# Patient Record
Sex: Female | Born: 1975 | Race: White | Hispanic: No | Smoking: Current every day smoker
Health system: Southern US, Community
[De-identification: ages and names within clinical notes are randomized; demographics above are authoritative.]

## PROBLEM LIST (undated history)

## (undated) DIAGNOSIS — K8020 Calculus of gallbladder without cholecystitis without obstruction: Secondary | ICD-10-CM

## (undated) DIAGNOSIS — D219 Benign neoplasm of connective and other soft tissue, unspecified: Secondary | ICD-10-CM

## (undated) HISTORY — PX: BACK SURGERY: SHX140

---

## 2004-04-26 ENCOUNTER — Emergency Department (HOSPITAL_COMMUNITY): Admission: EM | Admit: 2004-04-26 | Discharge: 2004-04-26 | Payer: Self-pay | Admitting: Emergency Medicine

## 2004-05-06 ENCOUNTER — Ambulatory Visit (HOSPITAL_COMMUNITY): Admission: RE | Admit: 2004-05-06 | Discharge: 2004-05-07 | Payer: Self-pay | Admitting: Neurosurgery

## 2004-05-06 ENCOUNTER — Emergency Department (HOSPITAL_COMMUNITY): Admission: EM | Admit: 2004-05-06 | Discharge: 2004-05-06 | Payer: Self-pay | Admitting: Emergency Medicine

## 2004-07-14 ENCOUNTER — Ambulatory Visit (HOSPITAL_COMMUNITY): Admission: RE | Admit: 2004-07-14 | Discharge: 2004-07-14 | Payer: Self-pay | Admitting: Neurosurgery

## 2005-01-03 ENCOUNTER — Emergency Department (HOSPITAL_COMMUNITY): Admission: EM | Admit: 2005-01-03 | Discharge: 2005-01-03 | Payer: Self-pay | Admitting: Emergency Medicine

## 2009-10-17 ENCOUNTER — Emergency Department (HOSPITAL_COMMUNITY): Admission: EM | Admit: 2009-10-17 | Discharge: 2009-10-17 | Payer: Self-pay | Admitting: Emergency Medicine

## 2011-03-09 LAB — POCT I-STAT, CHEM 8
BUN: 10 mg/dL (ref 6–23)
Hemoglobin: 15.6 g/dL — ABNORMAL HIGH (ref 12.0–15.0)
TCO2: 23 mmol/L (ref 0–100)

## 2011-03-09 LAB — URINALYSIS, ROUTINE W REFLEX MICROSCOPIC
Glucose, UA: NEGATIVE mg/dL
Leukocytes, UA: NEGATIVE
Nitrite: NEGATIVE
Protein, ur: NEGATIVE mg/dL
Specific Gravity, Urine: 1.02 (ref 1.005–1.030)
pH: 5.5 (ref 5.0–8.0)

## 2011-03-09 LAB — GC/CHLAMYDIA PROBE AMP, GENITAL: GC Probe Amp, Genital: NEGATIVE

## 2011-03-09 LAB — URINE MICROSCOPIC-ADD ON

## 2011-03-09 LAB — WET PREP, GENITAL
Trich, Wet Prep: NONE SEEN
WBC, Wet Prep HPF POC: NONE SEEN

## 2011-04-22 NOTE — H&P (Signed)
Destiny Mercado, Destiny Mercado                         ACCOUNT NO.:  [id number]   MEDICAL RECORD NO.:  [id number]                   PATIENT TYPE:  OIB   LOCATION:  2899                                 FACILITY:  [facility]   PHYSICIAN:  Payton Doughty, M.D.                   DATE OF BIRTH:  13-Apr-1976   DATE OF ADMISSION:  05/06/2004  DATE OF DISCHARGE:                                HISTORY & PHYSICAL   ADMISSION DIAGNOSIS:  Herniated disk, L4-5, L5-S1, on the left.   Very nice 35 year old white girl who the past couple of weeks had pain in  her leg. Got a lot worse over the past couple days, pain in her back and  down her left leg. Went to [facility] emergency room about 5 this  morning. Ended up with a MRI that shows a herniated disk at 4-5 and 5-1. He  came to my office with difficulty getting her foot flat on the floor and  unable to stand up straight. Bladder has not been affected. Medical history  is completely benign with no operations, using only ibuprofen, and no  allergies.   SOCIAL HISTORY:  She smokes a pack of cigarettes a day, drinks alcohol on  social basis, works as a Production assistant, radio in a [employer]   FAMILY HISTORY:  Mom is 51, dad is 50, both are in good health with no  complications. There is diabetes in the family.   REVIEW OF SYSTEMS:  Remarkable for back pain, leg pain.   PHYSICAL EXAMINATION:  HEENT:  Within normal limits.  NECK:  She has good range of motion of her neck.  CHEST:  Clear.  CARDIAC:  Regular rate and rhythm.  ABDOMEN:  Nontender, no hepatosplenomegaly.  EXTREMITIES:  Without clubbing or cyanosis. Peripheral pulses are good.  GENITOURINARY:  Deferred.  NEUROLOGICAL:  She is awake, alert, and oriented. Cranial nerves are intact.  Motor exam shows 5/5 strength throughout the upper and lower extremities  save for the dorsi flexion of the left foot. Exam is very difficult because  she cannot sit, but she can dorsi and plantar flex her foot, but is  slightly  weaker than the right. She has numbness in L5 and S1 distribution on the  left. Straight leg raising is exquisitely positive.   STUDIES:  MR shows a large disk at 4-5 on the left __________ disk at 5-1 on  the left.   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   PLAN:  Lumbar laminectomy diskectomy at 4-5 and 5-1 on the left side. The  risks and benefits of this approach have been discussed with her, and she  wishes to proceed.  Payton Doughty, M.D.   MWR/MEDQ  D:  05/06/2004  T:  05/06/2004  Job:  [id number]

## 2011-04-22 NOTE — Op Note (Signed)
Destiny Mercado, Destiny Mercado                         ACCOUNT NO.:  [id number]   MEDICAL RECORD NO.:  [id number]                   PATIENT TYPE:  OIB   LOCATION:  2899                                 FACILITY:  [facility]   PHYSICIAN:  Payton Doughty, M.D.                   DATE OF BIRTH:  07-Feb-1976   DATE OF PROCEDURE:  05/06/2004  DATE OF DISCHARGE:                                 OPERATIVE REPORT   PREOPERATIVE DIAGNOSIS:  Herniated disc L4-L5 and L5-S1, left.   POSTOPERATIVE DIAGNOSIS:  Herniated disc L4-L5 and L5-S1, left.   OPERATIVE PROCEDURE:  L4-L5 and L5-S1 laminectomy and discectomy on the left  side.   SURGEON:  Payton Doughty, M.D.   SERVICE:  Neurosurgery   ANESTHESIA:  General endotracheal anesthesia.   PREPARATION:  Betadine and alcohol wipe.   COMPLICATIONS:  None.   BODY OF TEXT:  35 year old girl with left L5 and S1 radiculopathy.  She has  a herniated disc at L4-L5 and L5-S1.  She is taken to the operating room,  smoothly anesthetized and intubated, placed prone on the operating table.  Following shave, prep, and drape in the usual sterile fashion, the skin was  infiltrated with 1% lidocaine with 1:100,000 epinephrine.  The skin was  incised from mid L4 to mid S1 and the lamina of L4 and L5 were exposed on  the left side in a subperiosteal plane.  Interoperative x-ray confirmed  correctness of the  level.  Semi-hemilaminectomy was carried out first at L5  to the top of ligamentum flavum that was removed in a retrograde fashion.  This allowed exposure of the lateral aspect of the S1 nerve root which was  gently dissected free.  Underneath it was found the herniated disc with a  few remaining annular fibers.  These fibers were gently divided and extruded  disc material removed.  The disc space was carefully explored and all  graspable fragments were removed.  The nerve was explored in all quadrants  and found to be free.  Attention was turned to the 4-5 level where a  semi-  hemilaminectomy was carried out to the top of ligamentum flavum and this  level was also removed in a retrograde fashion.  This demonstrated the  lateral aspect of the L5 root which was gently dissected free and a large  herniated disc fragment was located underneath it.  It was grasped and  removed without difficulty.  The disc space here was well covered and the  edges of it were explored and no graspable fragments were found.  The nerve  root was explored in all quadrants and found to be free.  The wound was  irrigated and hemostasis assured.  Depo-Medrol soaked fat was used to cover  the laminectomy defect.  The fascia was reapproximated with 0 Vicryl in an  interrupted fashion, the subcutaneous tissue were reapproximated with 0  Vicryl in an interrupted fashion, the subcuticular tissues were  reapproximated with 3-0 Vicryl in an interrupted fashion, and the skin was  closed with 4-0 Vicryl in a running subcuticular fashion.  Betadine and  Telfa dressing was applied and made occlusive with OpSite.  The patient  returned to the recovery room in good condition.                                              Payton Doughty, M.D.   MWR/MEDQ  D:  05/06/2004  T:  05/06/2004  Job:  [id number]

## 2018-05-16 ENCOUNTER — Telehealth: Payer: Self-pay | Admitting: General Practice

## 2018-05-16 NOTE — Telephone Encounter (Signed)
-----   Message from [name] sent at 05/16/2018  9:01 AM EDT ----- Destiny Mercado this patient needs a breast center appointment. She found a knot in her breast, and does not have a PCP.  Thanks

## 2018-05-16 NOTE — Telephone Encounter (Signed)
Called patient stating I am returning her phone call and asked patient for more information regarding mammogram hx, insurance status and description of the lump she found. Patient states she has never had a mammogram & currently does not have insurance. Patient reports recently palpating a quarter size knot right above her nipple of the right breast. Discussed with patient we will refer her to [facility], which is a program our office partners with. They will see/assess her and ensure she receives proper follow up without incurring a large bill so cost can be covered. Patient verbalized understanding and will await phone call. Email sent to [facility]

## 2018-05-24 ENCOUNTER — Other Ambulatory Visit (HOSPITAL_COMMUNITY): Payer: Self-pay | Admitting: *Deleted

## 2018-05-24 DIAGNOSIS — N6310 Unspecified lump in the right breast, unspecified quadrant: Secondary | ICD-10-CM

## 2018-06-05 ENCOUNTER — Encounter (HOSPITAL_COMMUNITY): Payer: Self-pay

## 2018-06-05 ENCOUNTER — Ambulatory Visit
Admission: RE | Admit: 2018-06-05 | Discharge: 2018-06-05 | Disposition: A | Discharge status: Home / Self Care | Payer: Self-pay | Source: Ambulatory Visit | Attending: Obstetrics and Gynecology | Admitting: Obstetrics and Gynecology

## 2018-06-05 ENCOUNTER — Ambulatory Visit (HOSPITAL_COMMUNITY)
Admission: RE | Admit: 2018-06-05 | Discharge: 2018-06-05 | Disposition: A | Discharge status: Home / Self Care | Payer: Self-pay | Source: Ambulatory Visit | Attending: Obstetrics and Gynecology | Admitting: Obstetrics and Gynecology

## 2018-06-05 VITALS — BP 112/70 | Ht 66.5 in | Wt 158.0 lb

## 2018-06-05 DIAGNOSIS — N6315 Unspecified lump in the right breast, overlapping quadrants: Secondary | ICD-10-CM

## 2018-06-05 DIAGNOSIS — Z01419 Encounter for gynecological examination (general) (routine) without abnormal findings: Secondary | ICD-10-CM

## 2018-06-05 DIAGNOSIS — N6310 Unspecified lump in the right breast, unspecified quadrant: Secondary | ICD-10-CM

## 2018-06-05 NOTE — Progress Notes (Signed)
Complaints of right breast lump x 3 weeks.  Pap Smear: Pap smear completed today. Last Pap smear was 5-6 years ago in [place] and normal per patient. Per patient has a history of an abnormal Pap smear when she was 60 or 42 years old that a colposcopy was completed for follow-up. Per patient all Pap smears have been normal since colposcopy. No Pap smear results are in Epic.  Physical exam: Breasts Breasts symmetrical. No skin abnormalities bilateral breasts. No nipple retraction bilateral breasts. No nipple discharge bilateral breasts. No lymphadenopathy. No lumps palpated left breast. Palpated a lump within the right breast at 12 o'clock above the areola. No complaints of pain or tenderness on exam. Referred patient to the [facility] for a diagnostic mammogram and right breast ultrasound. Appointment scheduled for Tuesday, June 05, 2018 at 1050.        Pelvic/Bimanual   Ext Genitalia No lesions, no swelling and no discharge observed on external genitalia.         Vagina Vagina pink and normal texture. No lesions or discharge observed in vagina.          Cervix Cervix is present. Cervix pink and of normal texture. No discharge observed.     Uterus Uterus is present and palpable. Uterus in normal position, enlarged, and firm. Will refer patient to the [facility] [place] for follow-up.        Adnexae Bilateral ovaries present and palpable. No tenderness on palpation.         Rectovaginal No rectal exam completed today since patient had no rectal complaints. No skin abnormalities observed on exam.    Smoking History: Patient is a current smoker. Discussed smoking cessation with patient. Referred to the [facility] Quitline and gave resources to free smoking cessation classes at [facility].  Patient Navigation: Patient education provided. Access to services provided for patient through [facility] program.   Breast and Cervical Cancer Risk Assessment: Patient has a family  history of her maternal grandmother having breast cancer. Patient has no known genetic mutations or history of radiation treatment to the chest before age 57. Per patient has no history of cervical dysplasia. Patient has no history of being immunocompromised or DES exposure in-utero.  Risk Assessment    Risk Scores      06/05/2018   Last edited by: Armond Hang, LPN   5-year risk: 0.7 %   Lifetime risk: 12.1 %

## 2018-06-05 NOTE — Patient Instructions (Signed)
Explained breast self awareness with Destiny Mercado. Let patient know [facility] will cover Pap smears and HPV typing every 5 years unless has a history of abnormal Pap smears. Referred patient to the [facility] for a diagnostic mammogram and right breast ultrasound. Appointment scheduled for Tuesday, June 05, 2018 at 1050. Let patient know will follow up with her within the next couple weeks with results of Pap smear by letter or phone. Told patient that I will refer her to the [facility] [place] for her enlarged uterus. Let patient know that someone from their office should call her with an appointment. Explained to her that the visit will not be covered by [facility] but can complete financial assistance paperwork. Discussed smoking cessation with patient. Referred to the [facility] Quitline and gave resources to free smoking cessation classes at [facility]. Destiny Mercado verbalized understanding.  Destiny Mercado, [name], RN 9:22 AM

## 2018-06-06 ENCOUNTER — Encounter (HOSPITAL_COMMUNITY): Payer: Self-pay | Admitting: *Deleted

## 2018-06-07 LAB — CYTOLOGY - PAP
ADEQUACY: ABSENT
Diagnosis: NEGATIVE
HPV: NOT DETECTED

## 2018-06-12 ENCOUNTER — Encounter: Payer: Self-pay | Admitting: Obstetrics and Gynecology

## 2018-06-15 ENCOUNTER — Encounter (HOSPITAL_COMMUNITY): Payer: Self-pay | Admitting: *Deleted

## 2018-06-15 NOTE — Progress Notes (Signed)
Letter mailed to patient with negative pap smear results. HPV was negative. Next pap smear due in five years. 

## 2018-07-24 ENCOUNTER — Encounter: Payer: Self-pay | Admitting: Obstetrics and Gynecology

## 2020-04-24 ENCOUNTER — Emergency Department (HOSPITAL_BASED_OUTPATIENT_CLINIC_OR_DEPARTMENT_OTHER): Payer: Self-pay

## 2020-04-24 ENCOUNTER — Encounter (HOSPITAL_BASED_OUTPATIENT_CLINIC_OR_DEPARTMENT_OTHER): Payer: Self-pay | Admitting: Emergency Medicine

## 2020-04-24 ENCOUNTER — Emergency Department (HOSPITAL_BASED_OUTPATIENT_CLINIC_OR_DEPARTMENT_OTHER)
Admission: EM | Admit: 2020-04-24 | Discharge: 2020-04-24 | Disposition: A | Discharge status: Home / Self Care | Payer: Self-pay | Attending: Emergency Medicine | Admitting: Emergency Medicine

## 2020-04-24 ENCOUNTER — Other Ambulatory Visit: Payer: Self-pay

## 2020-04-24 DIAGNOSIS — F17210 Nicotine dependence, cigarettes, uncomplicated: Secondary | ICD-10-CM | POA: Insufficient documentation

## 2020-04-24 DIAGNOSIS — D259 Leiomyoma of uterus, unspecified: Secondary | ICD-10-CM | POA: Insufficient documentation

## 2020-04-24 DIAGNOSIS — K8020 Calculus of gallbladder without cholecystitis without obstruction: Secondary | ICD-10-CM | POA: Insufficient documentation

## 2020-04-24 DIAGNOSIS — K8050 Calculus of bile duct without cholangitis or cholecystitis without obstruction: Secondary | ICD-10-CM

## 2020-04-24 DIAGNOSIS — R1900 Intra-abdominal and pelvic swelling, mass and lump, unspecified site: Secondary | ICD-10-CM | POA: Insufficient documentation

## 2020-04-24 LAB — CBC WITH DIFFERENTIAL/PLATELET
Abs Immature Granulocytes: 0.02 10*3/uL (ref 0.00–0.07)
Basophils Absolute: 0.1 10*3/uL (ref 0.0–0.1)
Basophils Relative: 1 %
Eosinophils Absolute: 0.2 10*3/uL (ref 0.0–0.5)
Eosinophils Relative: 3 %
HCT: 43 % (ref 36.0–46.0)
Hemoglobin: 14.6 g/dL (ref 12.0–15.0)
Immature Granulocytes: 0 %
Lymphocytes Relative: 23 %
Lymphs Abs: 1.9 10*3/uL (ref 0.7–4.0)
MCH: 32.7 pg (ref 26.0–34.0)
MCHC: 34 g/dL (ref 30.0–36.0)
MCV: 96.4 fL (ref 80.0–100.0)
Monocytes Absolute: 0.5 10*3/uL (ref 0.1–1.0)
Monocytes Relative: 7 %
Neutro Abs: 5.3 10*3/uL (ref 1.7–7.7)
Neutrophils Relative %: 66 %
Platelets: 226 10*3/uL (ref 150–400)
RBC: 4.46 MIL/uL (ref 3.87–5.11)
RDW: 13.4 % (ref 11.5–15.5)
WBC: 7.9 10*3/uL (ref 4.0–10.5)
nRBC: 0 % (ref 0.0–0.2)

## 2020-04-24 LAB — BASIC METABOLIC PANEL
Anion gap: 8 (ref 5–15)
BUN: 12 mg/dL (ref 6–20)
CO2: 24 mmol/L (ref 22–32)
Calcium: 9.1 mg/dL (ref 8.9–10.3)
Chloride: 103 mmol/L (ref 98–111)
Creatinine, Ser: 0.98 mg/dL (ref 0.44–1.00)
GFR calc Af Amer: >60 mL/min (ref >60)
GFR calc non Af Amer: >60 mL/min (ref >60)
Glucose, Bld: 95 mg/dL (ref 70–99)
Potassium: 4.2 mmol/L (ref 3.5–5.1)
Sodium: 135 mmol/L (ref 135–145)

## 2020-04-24 LAB — URINALYSIS, ROUTINE W REFLEX MICROSCOPIC
Bilirubin Urine: NEGATIVE
Glucose, UA: NEGATIVE mg/dL
Ketones, ur: NEGATIVE mg/dL
Leukocytes,Ua: NEGATIVE
Nitrite: NEGATIVE
Protein, ur: NEGATIVE mg/dL
Specific Gravity, Urine: 1.025 (ref 1.005–1.030)
pH: 5.5 (ref 5.0–8.0)

## 2020-04-24 LAB — LIPASE, BLOOD: Lipase: 40 U/L (ref 11–51)

## 2020-04-24 LAB — URINALYSIS, MICROSCOPIC (REFLEX)

## 2020-04-24 LAB — HEPATIC FUNCTION PANEL
ALT: 18 U/L (ref 0–44)
AST: 17 U/L (ref 15–41)
Albumin: 4.3 g/dL (ref 3.5–5.0)
Alkaline Phosphatase: 57 U/L (ref 38–126)
Bilirubin, Direct: <0.1 mg/dL (ref 0.0–0.2)
Total Bilirubin: 0.4 mg/dL (ref 0.3–1.2)
Total Protein: 7.5 g/dL (ref 6.5–8.1)

## 2020-04-24 LAB — PREGNANCY, URINE: Preg Test, Ur: NEGATIVE

## 2020-04-24 MED ORDER — IOHEXOL 300 MG/ML  SOLN
100.0000 mL | Freq: Once | INTRAMUSCULAR | Status: AC | PRN
  Administered 2020-04-24: 100 mL via INTRAVENOUS

## 2020-04-24 NOTE — ED Provider Notes (Signed)
[facility] EMERGENCY DEPARTMENT Provider Note   CSN: [id number] Arrival date & time: 04/24/20  1515     History Chief Complaint  Patient presents with  . Abdominal Pain    Destiny Mercado is a 44 y.o. female.  HPI Patient presents with abdominal pain and mass.  Began around an hour and half ago.  States she has had her abdomen swell up.  Last menses was a month ago.  Denies pregnancy.  States she felt a small bump in her abdomen around a month ago but that resolved.  No fevers.  No previous abdominal surgeries.  No nausea or vomiting.  No diarrhea or constipation.    History reviewed. No pertinent past medical history.  There are no problems to display for this patient.   Past Surgical History:  Procedure Laterality Date  . BACK SURGERY       OB History    Gravida  0   Para  0   Term  0   Preterm  0   AB  0   Living  0     SAB  0   TAB  0   Ectopic  0   Multiple  0   Live Births  0           Family History  Problem Relation Age of Onset  . Diabetes Brother   . Breast cancer Maternal Grandmother     Social History   Tobacco Use  . Smoking status: Current Every Day Smoker    Packs/day: 0.25    Years: 20.00    Pack years: 5.00    Types: Cigarettes  . Smokeless tobacco: Never Used  Substance Use Topics  . Alcohol use: Yes    Comment: occassionally  . Drug use: Never    Home Medications Prior to Admission medications   Not on File    Allergies    Patient has no known allergies.  Review of Systems   Review of Systems  Constitutional: Negative for appetite change.  HENT: Negative for congestion.   Respiratory: Negative for shortness of breath.   Gastrointestinal: Positive for abdominal pain. Negative for vomiting.  Genitourinary: Negative for flank pain.  Musculoskeletal: Negative for back pain.  Neurological: Negative for weakness.  Psychiatric/Behavioral: Negative for confusion.    Physical Exam Updated Vital  Signs BP 140/87 (BP Location: Right Arm)   Pulse 88   Temp 98.5 F (36.9 C) (Oral)   Resp 20   Ht 5\' 6"  (1.676 m)   Wt 72 kg   LMP 03/30/2020 (Approximate)   SpO2 99%   BMI 25.62 kg/m   Physical Exam Vitals and nursing note reviewed.  Cardiovascular:     Rate and Rhythm: Normal rate.  Pulmonary:     Breath sounds: Normal breath sounds.  Abdominal:     General: There is no abdominal bruit.     Palpations: There is mass.     Hernia: No hernia is present.     Comments: Suprapubic/lower abdominal mass.  Goes to above umbilicus.  Nonpulsatile.  Skin:    General: Skin is warm.     Capillary Refill: Capillary refill takes less than 2 seconds.  Neurological:     Mental Status: She is alert.     ED Results / Procedures / Treatments   Labs (all labs ordered are listed, but only abnormal results are displayed) Labs Reviewed  URINALYSIS, ROUTINE W REFLEX MICROSCOPIC - Abnormal; Notable for the following components:  Result Value   Hgb urine dipstick SMALL (*)    All other components within normal limits  URINALYSIS, MICROSCOPIC (REFLEX) - Abnormal; Notable for the following components:   Bacteria, UA RARE (*)    All other components within normal limits  PREGNANCY, URINE  BASIC METABOLIC PANEL  CBC WITH DIFFERENTIAL/PLATELET  HEPATIC FUNCTION PANEL  LIPASE, BLOOD    EKG None  Radiology CT ABDOMEN PELVIS W CONTRAST  Result Date: 04/24/2020 CLINICAL DATA:  Periumbilical abdominal pain. EXAM: CT ABDOMEN AND PELVIS WITH CONTRAST TECHNIQUE: Multidetector CT imaging of the abdomen and pelvis was performed using the standard protocol following bolus administration of intravenous contrast. CONTRAST:  15mL OMNIPAQUE IOHEXOL 300 MG/ML  SOLN COMPARISON:  None. FINDINGS: Lower chest: No acute abnormality. Hepatobiliary: No focal liver abnormality is seen. 1.8 cm and 0.9 cm coarse gallstones are seen within the lumen of an otherwise normal-appearing gallbladder. There is no  evidence of gallbladder wall thickening or biliary dilatation. Pancreas: Unremarkable. No pancreatic ductal dilatation or surrounding inflammatory changes. Spleen: Normal in size without focal abnormality. Adrenals/Urinary Tract: Adrenal glands are unremarkable. Kidneys are normal, without renal calculi, focal lesion, or hydronephrosis. Bladder is unremarkable. Stomach/Bowel: Stomach is within normal limits. Appendix appears normal. No evidence of bowel wall thickening, distention, or inflammatory changes. Vascular/Lymphatic: No significant vascular findings are present. No enlarged abdominal or pelvic lymph nodes. Reproductive: Numerous heterogeneous low-attenuation masses of various sizes are seen throughout an enlarged, lobulated uterus. A 2.0 cm cyst is seen along the posterior aspect of the right adnexa. Other: No abdominal wall hernia or abnormality. No abdominopelvic ascites. Musculoskeletal: Moderate severity degenerative changes seen at the levels of L4-L5 and L5-S1. IMPRESSION: 1. Cholelithiasis. 2. Enlarged, fibroid uterus. 3. Small right adnexal cyst, likely ovarian in origin. Electronically Signed   By: Virgina Norfolk M.D.   On: 04/24/2020 17:30   DG Abd 2 Views  Result Date: 04/24/2020 CLINICAL DATA:  Sudden onset of mid abdominal pain. Abdominal mass on ultrasound. EXAM: ABDOMEN - 2 VIEW COMPARISON:  None. FINDINGS: No free intra-abdominal air. No bowel dilatation to suggest obstruction. Moderate stool in the proximal colon, small volume of stool distally. Few adjacent rounded calcifications in the right upper quadrant measuring up to 17 mm, suspicious for gallstones but nonspecific. There is soft tissue fullness in the central abdomen on the supine view. Calcifications in the pelvis are typical of phleboliths. Lung bases are clear. No osseous abnormalities. IMPRESSION: 1. Soft tissue fullness in the central abdomen on the supine view, may represent reported abdominal mass, nonspecific in  radiographic appearance. Recommend further evaluation with CT. 2. Probable gallstones. Electronically Signed   By: Keith Rake M.D.   On: 04/24/2020 16:30    Procedures Procedures (including critical care time)  Medications Ordered in ED Medications  iohexol (OMNIPAQUE) 300 MG/ML solution 100 mL (100 mLs Intravenous Contrast Given 04/24/20 1644)    ED Course  I have reviewed the triage vital signs and the nursing notes.  Pertinent labs & imaging results that were available during my care of the patient were reviewed by me and considered in my medical decision making (see chart for details).    MDM Rules/Calculators/A&P                      Patient with abdominal pain.  Has been periumbilical and initially thought that it could be related to uterine fibroids.  Had unknown mass in abdomen but found to have large fibroids.  Not pregnant.  However patient states she later developed right upper quadrant pain.  Gallstones seen on CT scan without signs of cholecystitis.  Patient feels somewhat better.  LFTs normal.  I think patient stable for discharge home.  I think more likely biliary colic as opposed to a cholecystitis.  Follow-up with general surgery and GYN. Final Clinical Impression(s) / ED Diagnoses Final diagnoses:  Abdominal mass  Biliary colic  Uterine leiomyoma, unspecified location    Rx / DC Orders ED Discharge Orders    None       Davonna Belling, MD 04/24/20 [phone]

## 2020-04-24 NOTE — ED Triage Notes (Signed)
Sudden onset of mid abd pain 1.5 hours pta. Denies n/v/d/

## 2020-04-24 NOTE — Discharge Instructions (Signed)
Follow-up with OB/GYN for the fibroids and the surgeon for the biliary colic.  If the pain in the upper abdomen continues or worsens you may need to be seen sooner.

## 2021-05-02 ENCOUNTER — Other Ambulatory Visit: Payer: Self-pay

## 2021-05-02 ENCOUNTER — Emergency Department (HOSPITAL_BASED_OUTPATIENT_CLINIC_OR_DEPARTMENT_OTHER): Payer: Self-pay

## 2021-05-02 ENCOUNTER — Encounter (HOSPITAL_BASED_OUTPATIENT_CLINIC_OR_DEPARTMENT_OTHER): Payer: Self-pay | Admitting: *Deleted

## 2021-05-02 ENCOUNTER — Emergency Department (HOSPITAL_BASED_OUTPATIENT_CLINIC_OR_DEPARTMENT_OTHER)
Admission: EM | Admit: 2021-05-02 | Discharge: 2021-05-02 | Disposition: A | Discharge status: Home / Self Care | Payer: Self-pay | Attending: Emergency Medicine | Admitting: Emergency Medicine

## 2021-05-02 DIAGNOSIS — R1010 Upper abdominal pain, unspecified: Secondary | ICD-10-CM | POA: Insufficient documentation

## 2021-05-02 DIAGNOSIS — D219 Benign neoplasm of connective and other soft tissue, unspecified: Secondary | ICD-10-CM | POA: Insufficient documentation

## 2021-05-02 DIAGNOSIS — R1900 Intra-abdominal and pelvic swelling, mass and lump, unspecified site: Secondary | ICD-10-CM | POA: Insufficient documentation

## 2021-05-02 HISTORY — DX: Benign neoplasm of connective and other soft tissue, unspecified: D21.9

## 2021-05-02 HISTORY — DX: Calculus of gallbladder without cholecystitis without obstruction: K80.20

## 2021-05-02 LAB — CBC WITH DIFFERENTIAL/PLATELET
Abs Immature Granulocytes: 0.02 10*3/uL (ref 0.00–0.07)
Basophils Absolute: 0.1 10*3/uL (ref 0.0–0.1)
Basophils Relative: 1 %
Eosinophils Absolute: 0.1 10*3/uL (ref 0.0–0.5)
Eosinophils Relative: 1 %
HCT: 48 % — ABNORMAL HIGH (ref 36.0–46.0)
Hemoglobin: 17 g/dL — ABNORMAL HIGH (ref 12.0–15.0)
Immature Granulocytes: 0 %
Lymphocytes Relative: 16 %
Lymphs Abs: 1.7 10*3/uL (ref 0.7–4.0)
MCH: 34.4 pg — ABNORMAL HIGH (ref 26.0–34.0)
MCHC: 35.4 g/dL (ref 30.0–36.0)
MCV: 97.2 fL (ref 80.0–100.0)
Monocytes Absolute: 0.9 10*3/uL (ref 0.1–1.0)
Monocytes Relative: 9 %
Neutro Abs: 7.4 10*3/uL (ref 1.7–7.7)
Neutrophils Relative %: 73 %
Platelets: 277 10*3/uL (ref 150–400)
RBC: 4.94 MIL/uL (ref 3.87–5.11)
RDW: 13.4 % (ref 11.5–15.5)
WBC: 10.2 10*3/uL (ref 4.0–10.5)
nRBC: 0 % (ref 0.0–0.2)

## 2021-05-02 LAB — URINALYSIS, ROUTINE W REFLEX MICROSCOPIC
Glucose, UA: NEGATIVE mg/dL
Ketones, ur: >80 mg/dL — AB
Leukocytes,Ua: NEGATIVE
Nitrite: NEGATIVE
Protein, ur: 30 mg/dL — AB
Specific Gravity, Urine: 1.015 (ref 1.005–1.030)
pH: 8 (ref 5.0–8.0)

## 2021-05-02 LAB — COMPREHENSIVE METABOLIC PANEL
ALT: 14 U/L (ref 0–44)
AST: 17 U/L (ref 15–41)
Albumin: 4.6 g/dL (ref 3.5–5.0)
Alkaline Phosphatase: 53 U/L (ref 38–126)
Anion gap: 15 (ref 5–15)
BUN: 11 mg/dL (ref 6–20)
CO2: 24 mmol/L (ref 22–32)
Calcium: 9.3 mg/dL (ref 8.9–10.3)
Chloride: 98 mmol/L (ref 98–111)
Creatinine, Ser: 0.97 mg/dL (ref 0.44–1.00)
GFR, Estimated: >60 mL/min (ref >60)
Glucose, Bld: 103 mg/dL — ABNORMAL HIGH (ref 70–99)
Potassium: 3.3 mmol/L — ABNORMAL LOW (ref 3.5–5.1)
Sodium: 137 mmol/L (ref 135–145)
Total Bilirubin: 0.7 mg/dL (ref 0.3–1.2)
Total Protein: 8 g/dL (ref 6.5–8.1)

## 2021-05-02 LAB — URINALYSIS, MICROSCOPIC (REFLEX): Squamous Epithelial / HPF: >50 (ref 0–5)

## 2021-05-02 LAB — LACTIC ACID, PLASMA: Lactic Acid, Venous: 1.1 mmol/L (ref 0.5–1.9)

## 2021-05-02 LAB — LIPASE, BLOOD: Lipase: 24 U/L (ref 11–51)

## 2021-05-02 LAB — PREGNANCY, URINE: Preg Test, Ur: NEGATIVE

## 2021-05-02 MED ORDER — LACTATED RINGERS IV BOLUS
1000.0000 mL | Freq: Once | INTRAVENOUS | Status: AC
  Administered 2021-05-02: 1000 mL via INTRAVENOUS

## 2021-05-02 MED ORDER — PANTOPRAZOLE SODIUM 40 MG IV SOLR
40.0000 mg | Freq: Once | INTRAVENOUS | Status: AC
  Administered 2021-05-02: 40 mg via INTRAVENOUS
  Filled 2021-05-02: qty 40

## 2021-05-02 MED ORDER — HYDROMORPHONE HCL 1 MG/ML IJ SOLN
1.0000 mg | Freq: Once | INTRAMUSCULAR | Status: AC
Start: 2021-05-02 — End: 2021-05-02
  Administered 2021-05-02: 1 mg via INTRAVENOUS
  Filled 2021-05-02: qty 1

## 2021-05-02 MED ORDER — PANTOPRAZOLE SODIUM 40 MG PO TBEC
40.0000 mg | DELAYED_RELEASE_TABLET | Freq: Every day | ORAL | 0 refills | Status: DC
Start: 2021-05-02 — End: 2021-08-04

## 2021-05-02 MED ORDER — ONDANSETRON HCL 4 MG/2ML IJ SOLN
4.0000 mg | Freq: Once | INTRAMUSCULAR | Status: AC
  Administered 2021-05-02: 4 mg via INTRAVENOUS
  Filled 2021-05-02: qty 2

## 2021-05-02 MED ORDER — ONDANSETRON 8 MG PO TBDP: 8.0000 mg | ORAL_TABLET | Freq: Three times a day (TID) | ORAL | 0 refills | Status: DC | PRN

## 2021-05-02 MED ORDER — IOHEXOL 300 MG/ML  SOLN
100.0000 mL | Freq: Once | INTRAMUSCULAR | Status: AC | PRN
  Administered 2021-05-02: 100 mL via INTRAVENOUS

## 2021-05-02 NOTE — ED Notes (Signed)
PO challenge given; primary RN aware

## 2021-05-02 NOTE — ED Provider Notes (Signed)
[facility] DEPT [facility] Provider Note: Destiny Spurling, MD, FACEP  CSN: [id number] MRN: [id number] ARRIVAL: 05/02/21 at [place]: [place]  Abdominal Pain   HISTORY OF PRESENT ILLNESS  05/02/21 2:02 AM Destiny Mercado is a 45 y.o. female with a history of gallstones and massive uterine fibroids.  She is here with 3 weeks of difficulty eating.  Specifically she has difficulty eating or drinking and immediately feels like vomiting.  She has lost over 20 pounds in the past 3 weeks due to this.  She has not had diarrhea or constipation with this.  Yesterday the vomiting became persistent and has been associated with pain.  The pain was initially in the right upper quadrant and has subsequently moved leftward to the epigastrium and the left upper quadrant.  It is not significantly worse with movement or palpation.  She describes her vomiting as bilious.  She rates her pain as a 6 out of 10.   Past Medical History:  Diagnosis Date  . Fibroid   . Gallstones     Past Surgical History:  Procedure Laterality Date  . BACK SURGERY      Family History  Problem Relation Age of Onset  . Diabetes Brother   . Breast cancer Maternal Grandmother     Social History   Tobacco Use  . Smoking status: Current Every Day Smoker    Packs/day: 0.25    Years: 20.00    Pack years: 5.00    Types: Cigarettes  . Smokeless tobacco: Never Used  Vaping Use  . Vaping Use: Never used  Substance Use Topics  . Alcohol use: Yes    Comment: occassionally  . Drug use: Never    Prior to Admission medications   Medication Sig Start Date End Date Taking? Authorizing Provider  ondansetron (ZOFRAN ODT) 8 MG disintegrating tablet Take 1 tablet (8 mg total) by mouth every 8 (eight) hours as needed. 05/02/21  Yes Payten Beaumier, MD  pantoprazole (PROTONIX) 40 MG tablet Take 1 tablet (40 mg total) by mouth daily. 05/02/21  Yes Roxene Alviar, MD    Allergies Patient has no known  allergies.   REVIEW OF SYSTEMS  Negative except as noted here or in the History of Present Illness.   PHYSICAL EXAMINATION  Initial Vital Signs Blood pressure (!) 127/92, pulse (!) 128, temperature 98.4 F (36.9 C), temperature source Oral, resp. rate (!) 22, height 5\' 6"  (1.676 m), weight 62.6 kg, last menstrual period 04/11/2021, SpO2 98 %.  Examination General: Well-developed, well-nourished female in no acute distress; appearance consistent with age of record HENT: normocephalic; atraumatic Eyes: pupils equal, round and reactive to light; extraocular muscles intact Neck: supple Heart: regular rate and rhythm Lungs: clear to auscultation bilaterally Abdomen: soft; nondistended; minimal upper abdominal tenderness; large suprapubic mass; bowel sounds present Extremities: No deformity; full range of motion; pulses normal Neurologic: Awake, alert and oriented; motor function intact in all extremities and symmetric; no facial droop Skin: Warm and dry Psychiatric: Tearful   RESULTS  Summary of this visit's results, reviewed and interpreted by myself:   EKG Interpretation  Date/Time:  Sunday May 02 2021 01:17:33 EDT Ventricular Rate:  99 PR Interval:  125 QRS Duration: 88 QT Interval:  345 QTC Calculation: 443 R Axis:   55 Text Interpretation: Sinus rhythm Normal ECG No previous ECGs available Confirmed by Shanon Rosser [phone]) on 05/02/2021 1:22:33 AM      Laboratory Studies: Results for orders placed or performed during  the hospital encounter of 05/02/21 (from the past 24 hour(s))  Lactic acid, plasma     Status: None   Collection Time: 05/02/21  1:15 AM  Result Value Ref Range   Lactic Acid, Venous 1.1 0.5 - 1.9 mmol/L  Comprehensive metabolic panel     Status: Abnormal   Collection Time: 05/02/21  1:15 AM  Result Value Ref Range   Sodium 137 135 - 145 mmol/L   Potassium 3.3 (L) 3.5 - 5.1 mmol/L   Chloride 98 98 - 111 mmol/L   CO2 24 22 - 32 mmol/L   Glucose, Bld  103 (H) 70 - 99 mg/dL   BUN 11 6 - 20 mg/dL   Creatinine, Ser 0.97 0.44 - 1.00 mg/dL   Calcium 9.3 8.9 - 10.3 mg/dL   Total Protein 8.0 6.5 - 8.1 g/dL   Albumin 4.6 3.5 - 5.0 g/dL   AST 17 15 - 41 U/L   ALT 14 0 - 44 U/L   Alkaline Phosphatase 53 38 - 126 U/L   Total Bilirubin 0.7 0.3 - 1.2 mg/dL   GFR, Estimated >60 >60 mL/min   Anion gap 15 5 - 15  CBC with Differential     Status: Abnormal   Collection Time: 05/02/21  1:15 AM  Result Value Ref Range   WBC 10.2 4.0 - 10.5 K/uL   RBC 4.94 3.87 - 5.11 MIL/uL   Hemoglobin 17.0 (H) 12.0 - 15.0 g/dL   HCT 48.0 (H) 36.0 - 46.0 %   MCV 97.2 80.0 - 100.0 fL   MCH 34.4 (H) 26.0 - 34.0 pg   MCHC 35.4 30.0 - 36.0 g/dL   RDW 13.4 11.5 - 15.5 %   Platelets 277 150 - 400 K/uL   nRBC 0.0 0.0 - 0.2 %   Neutrophils Relative % 73 %   Neutro Abs 7.4 1.7 - 7.7 K/uL   Lymphocytes Relative 16 %   Lymphs Abs 1.7 0.7 - 4.0 K/uL   Monocytes Relative 9 %   Monocytes Absolute 0.9 0.1 - 1.0 K/uL   Eosinophils Relative 1 %   Eosinophils Absolute 0.1 0.0 - 0.5 K/uL   Basophils Relative 1 %   Basophils Absolute 0.1 0.0 - 0.1 K/uL   Immature Granulocytes 0 %   Abs Immature Granulocytes 0.02 0.00 - 0.07 K/uL  Lipase, blood     Status: None   Collection Time: 05/02/21  1:15 AM  Result Value Ref Range   Lipase 24 11 - 51 U/L  Urinalysis, Routine w reflex microscopic Urine, Clean Catch     Status: Abnormal   Collection Time: 05/02/21  2:26 AM  Result Value Ref Range   Color, Urine YELLOW YELLOW   APPearance HAZY (A) CLEAR   Specific Gravity, Urine 1.015 1.005 - 1.030   pH 8.0 5.0 - 8.0   Glucose, UA NEGATIVE NEGATIVE mg/dL   Hgb urine dipstick TRACE (A) NEGATIVE   Bilirubin Urine SMALL (A) NEGATIVE   Ketones, ur >80 (A) NEGATIVE mg/dL   Protein, ur 30 (A) NEGATIVE mg/dL   Nitrite NEGATIVE NEGATIVE   Leukocytes,Ua NEGATIVE NEGATIVE  Pregnancy, urine     Status: None   Collection Time: 05/02/21  2:26 AM  Result Value Ref Range   Preg Test, Ur  NEGATIVE NEGATIVE  Urinalysis, Microscopic (reflex)     Status: Abnormal   Collection Time: 05/02/21  2:26 AM  Result Value Ref Range   RBC / HPF 6-10 0 - 5 RBC/hpf   WBC, UA  0-5 0 - 5 WBC/hpf   Bacteria, UA MANY (A) NONE SEEN   Squamous Epithelial / LPF >50 0 - 5   Mucus PRESENT    Imaging Studies: CT ABDOMEN PELVIS W CONTRAST  Result Date: 05/02/2021 CLINICAL DATA:  Vomiting for 3 weeks, right upper quadrant pain, history of cholelithiasis, history of uterine fibroids EXAM: CT ABDOMEN AND PELVIS WITH CONTRAST TECHNIQUE: Multidetector CT imaging of the abdomen and pelvis was performed using the standard protocol following bolus administration of intravenous contrast. CONTRAST:  186mL OMNIPAQUE IOHEXOL 300 MG/ML  SOLN COMPARISON:  04/24/2020 FINDINGS: Lower chest: No acute pleural or parenchymal lung disease. Hepatobiliary: Gallbladder is decompressed, with multiple calcified gallstones identified within the gallbladder neck. No gallbladder wall thickening to suggest cholecystitis. The liver is unremarkable. No biliary dilation. Pancreas: Unremarkable. No pancreatic ductal dilatation or surrounding inflammatory changes. Spleen: Normal in size without focal abnormality. Adrenals/Urinary Tract: The kidneys enhance normally and symmetrically. No urinary tract calculi or obstruction. The adrenals are unremarkable. Bladder is decompressed, limiting its evaluation. Stomach/Bowel: No bowel obstruction or ileus. Normal appendix right lower quadrant. No bowel wall thickening or inflammatory change. Vascular/Lymphatic: No significant vascular findings are present. No enlarged abdominal or pelvic lymph nodes. Reproductive: There is massive heterogeneous enlargement of the uterus consistent with multiple fibroids. Liver measures approximately 21 x 14 x 16 cm, with numerous intramural and subserosal fibroids. Age-appropriate appearance of the ovaries. Other: There is trace free fluid within the cul-de-sac, likely  physiologic. No free intraperitoneal gas. There is a small fat containing umbilical hernia. No bowel herniation. Musculoskeletal: No acute or destructive bony lesions. Reconstructed images demonstrate no additional findings. IMPRESSION: 1. Cholelithiasis without evidence of cholecystitis. 2. Large fibroid uterus, increased in size since prior study. 3. Trace pelvic free fluid, likely physiologic. 4. Small fat containing umbilical hernia. Electronically Signed   By: Randa Ngo M.D.   On: 05/02/2021 03:44    ED COURSE and MDM  Nursing notes, initial and subsequent vitals signs, including pulse oximetry, reviewed and interpreted by myself.  Vitals:   05/02/21 0221 05/02/21 0230 05/02/21 0400 05/02/21 0500  BP: 125/87 116/80 99/79 99/79   Pulse: 95 (!) 113 97 97  Resp: 20 14 13 20   Temp:      TempSrc:      SpO2: 98% 96% 97% 100%  Weight:      Height:       Medications  ondansetron (ZOFRAN) injection 4 mg (4 mg Intravenous Given 05/02/21 0158)  HYDROmorphone (DILAUDID) injection 1 mg (1 mg Intravenous Given 05/02/21 0212)  lactated ringers bolus 1,000 mL (0 mLs Intravenous Stopped 05/02/21 0417)  iohexol (OMNIPAQUE) 300 MG/ML solution 100 mL (100 mLs Intravenous Contrast Given 05/02/21 0304)  pantoprazole (PROTONIX) injection 40 mg (40 mg Intravenous Given 05/02/21 0411)  lactated ringers bolus 1,000 mL (1,000 mLs Intravenous New Bag/Given 05/02/21 0412)   5:31 AM Patient is nausea controlled with Zofran and she has been able to drink fluids without vomiting.  Her CT does not show any obstruction or other acute cause for her pain but I suspect her pain is likely functional due to mass-effect of her massive fibrotic uterus.  She states she would like to be discharged home at this time.  We will treat her with an antiemetic and PPI and refer to her OB/GYN as she will almost certainly require a hysterectomy.   PROCEDURES  Procedures   ED DIAGNOSES     ICD-10-CM   1. Upper abdominal pain   R10.10  2. Fibroids  D21.9   3. Intraabdominal mass  R19.00        Morine Kohlman, Jenny Reichmann, MD 05/02/21 [phone]

## 2021-05-02 NOTE — ED Triage Notes (Addendum)
States she has not been "able to keep food down" x 3 weeks. She has been told she has gallstones. She reports RUQ pain tonight and "vomiting bile".  Reports > 20lb weight loss in the last 3 weeks

## 2021-05-02 NOTE — ED Notes (Signed)
O2 sat noted to drop after giving dilaudid to 88%.  Placed on 2L.  Rebounded quickly.

## 2021-05-02 NOTE — ED Notes (Signed)
O2 sat remains at 99% on room air after removing oxygen.

## 2021-05-09 IMAGING — CT CT ABD-PELV W/ CM
2 of 5 series · 16 of 46 positions shown, 18 images · IV contrast (Omnipaque)
Comparison: None.

CLINICAL DATA: Periumbilical abdominal pain.

EXAM:
CT ABDOMEN AND PELVIS WITH CONTRAST
TECHNIQUE: Multidetector CT imaging of the abdomen and pelvis was performed
using the standard protocol following bolus administration of
intravenous contrast.
CONTRAST:  100mL OMNIPAQUE IOHEXOL 300 MG/ML  SOLN

[Series 2: axial st · axial · 0.92mm/px · z∈[-434,-4]mm · 13 of 100 slices shown, 15 images]
[im 7/100  soft-tissue]
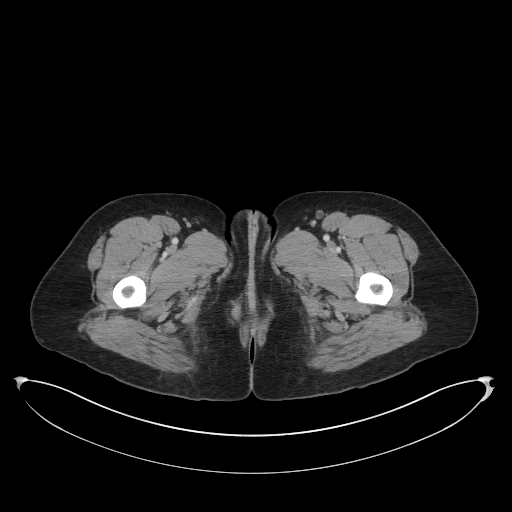
[im 7/100  bone]
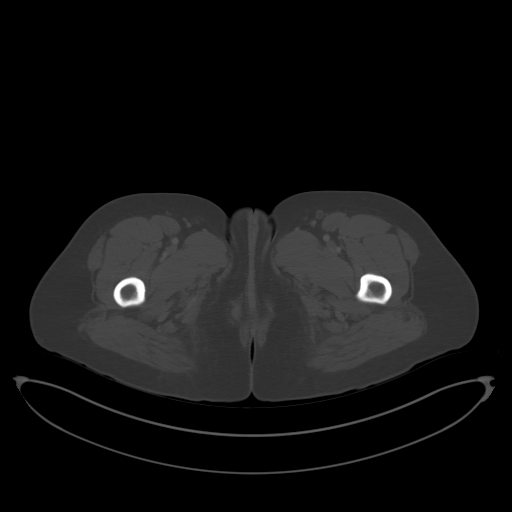
[im 14/100  soft-tissue]
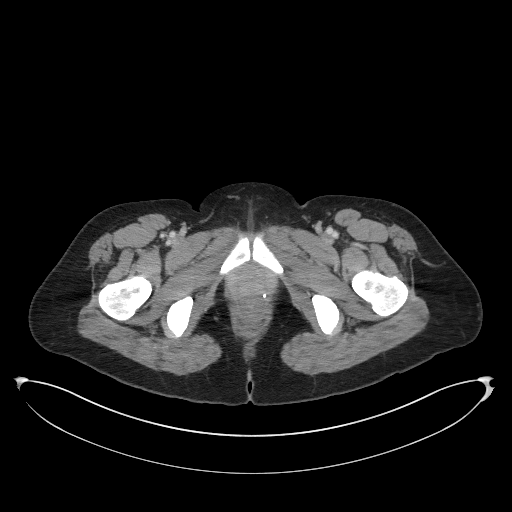
[im 20/100  soft-tissue]
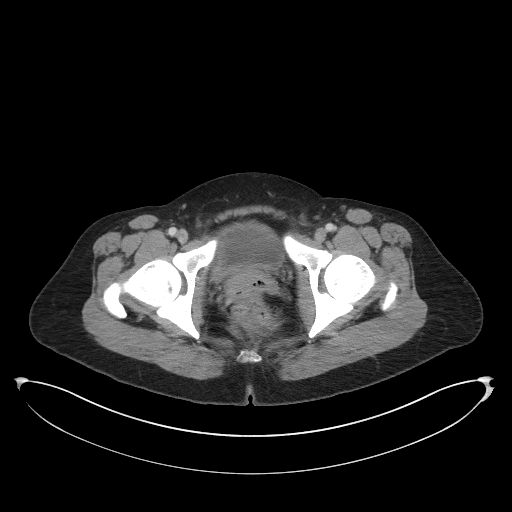
[im 27/100  soft-tissue]
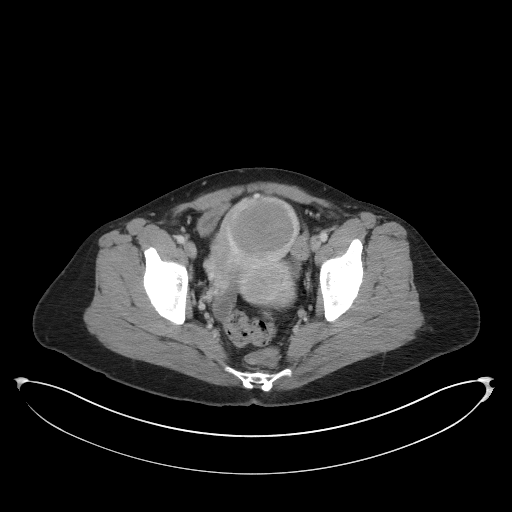
[im 34/100  soft-tissue]
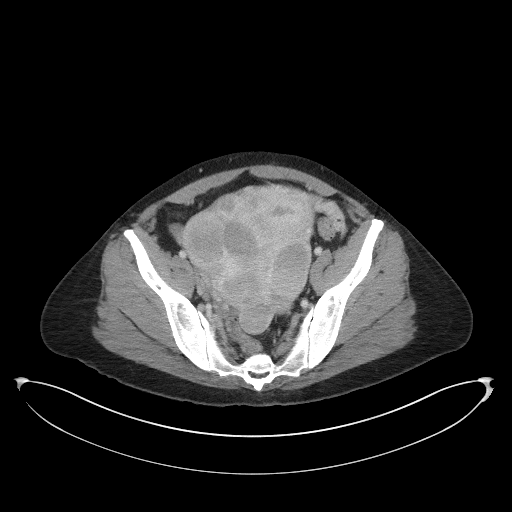
[im 40/100  soft-tissue]
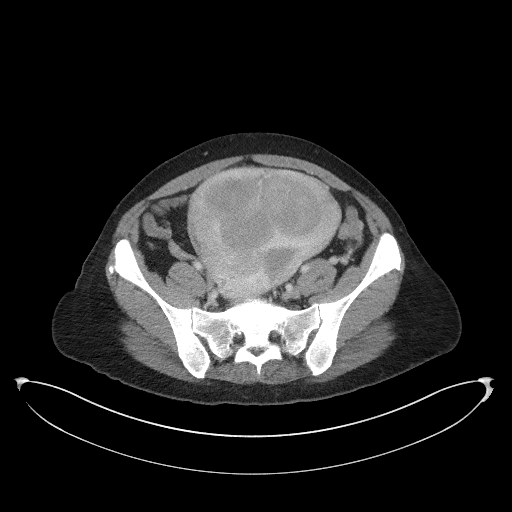
[im 53/100  soft-tissue]
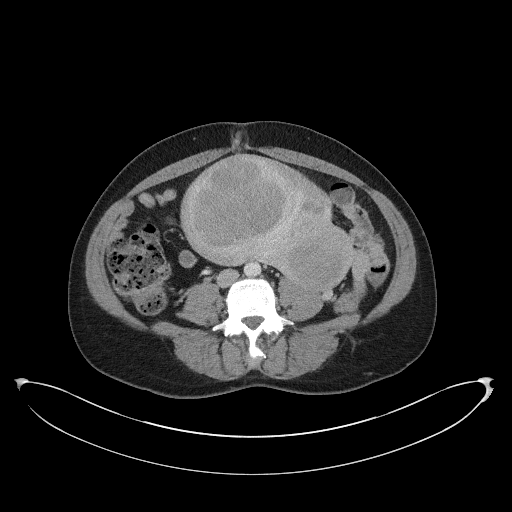
[im 60/100  soft-tissue]
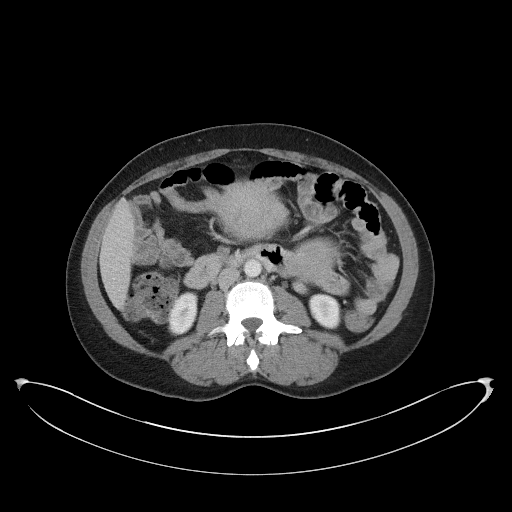
[im 67/100  soft-tissue]
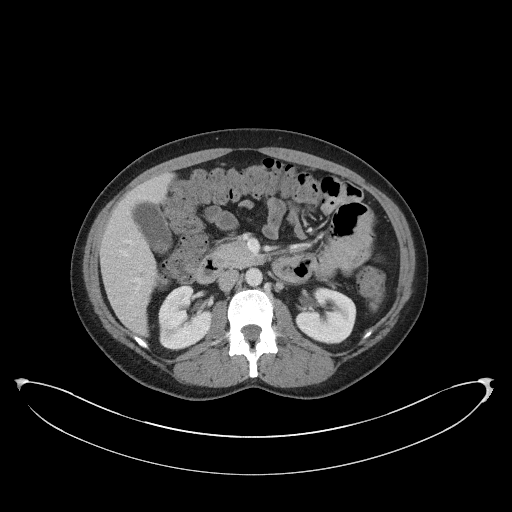
[im 67/100  bone]
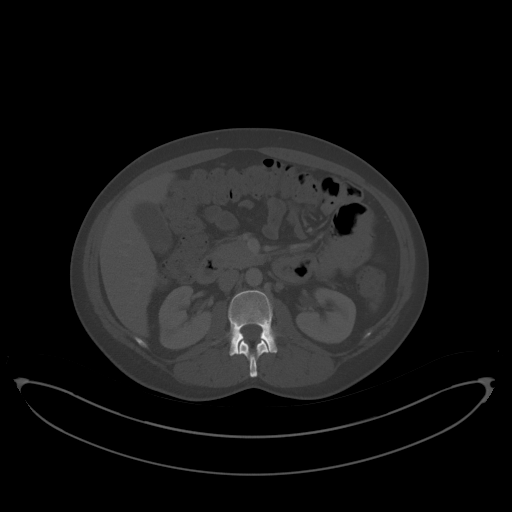
[im 73/100  soft-tissue]
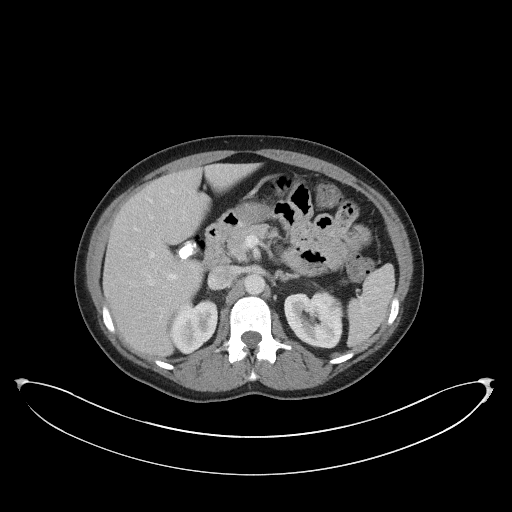
[im 80/100  soft-tissue]
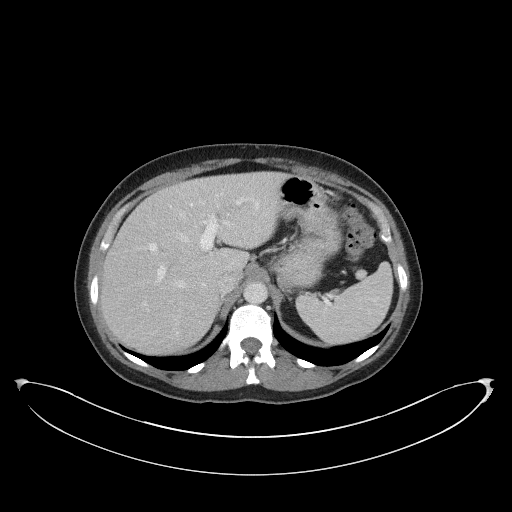
[im 86/100  soft-tissue]
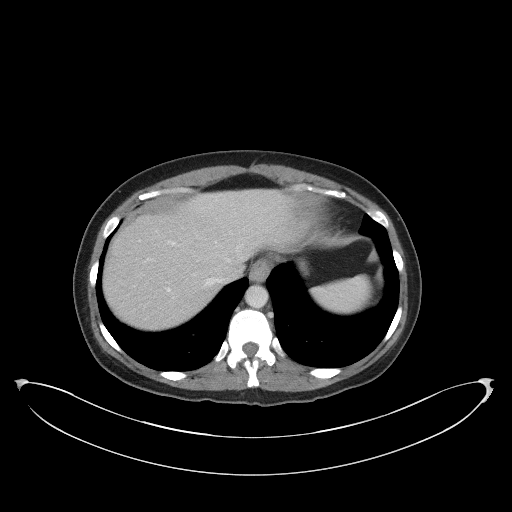
[im 93/100  soft-tissue]
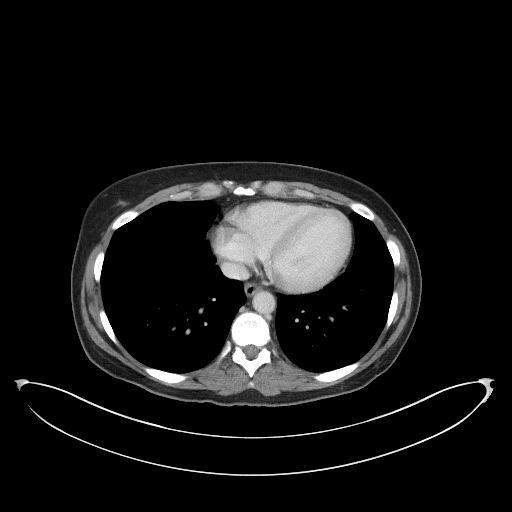

[Series 5: coronal st · coronal · 0.77mm/px · 3 of 100 slices shown]
[im 34/100  soft-tissue]
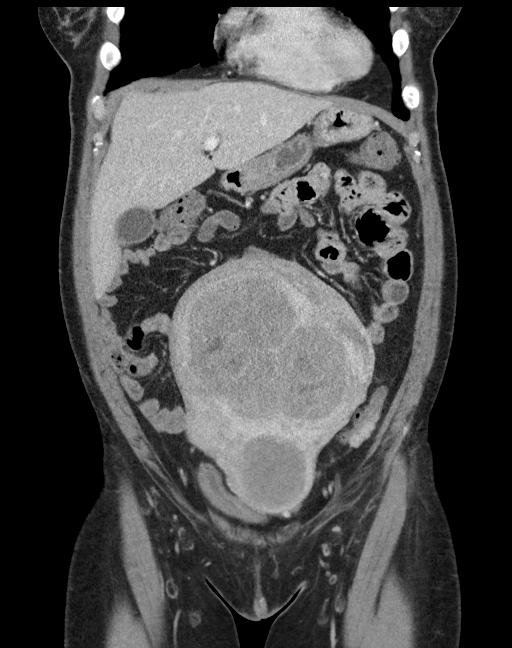
[im 45/100  soft-tissue]
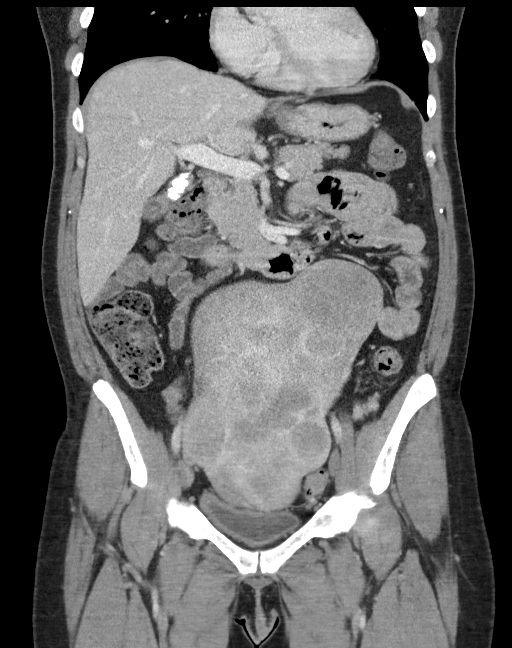
[im 56/100  soft-tissue]
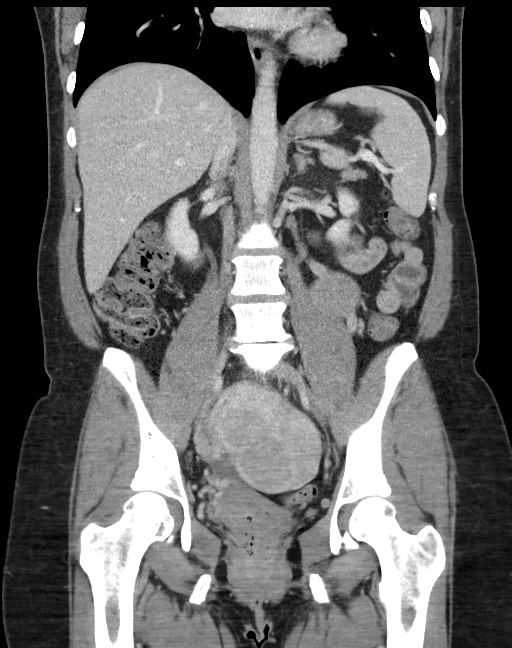

[16 of 46 positions shown; findings below may reference images not displayed]

FINDINGS: Lower chest: No acute abnormality.

Hepatobiliary: No focal liver abnormality is seen. 1.8 cm and 0.9 cm
coarse gallstones are seen within the lumen of an otherwise
normal-appearing gallbladder. There is no evidence of gallbladder
wall thickening or biliary dilatation.

Pancreas: Unremarkable. No pancreatic ductal dilatation or
surrounding inflammatory changes.

Spleen: Normal in size without focal abnormality.

Adrenals/Urinary Tract: Adrenal glands are unremarkable. Kidneys are
normal, without renal calculi, focal lesion, or hydronephrosis.
Bladder is unremarkable.

Stomach/Bowel: Stomach is within normal limits. Appendix appears
normal. No evidence of bowel wall thickening, distention, or
inflammatory changes.

Vascular/Lymphatic: No significant vascular findings are present. No
enlarged abdominal or pelvic lymph nodes.

Reproductive: Numerous heterogeneous low-attenuation masses of
various sizes are seen throughout an enlarged, lobulated uterus. A
2.0 cm cyst is seen along the posterior aspect of the right adnexa.

Other: No abdominal wall hernia or abnormality. No abdominopelvic
ascites.

Musculoskeletal: Moderate severity degenerative changes seen at the
levels of L4-L5 and L5-S1.
IMPRESSION: 1. Cholelithiasis.
2. Enlarged, fibroid uterus.
3. Small right adnexal cyst, likely ovarian in origin.

## 2021-05-09 IMAGING — CR DG ABDOMEN 2V
2 series · 2 of 2 positions shown · non-contrast
Comparison: None.

CLINICAL DATA: Sudden onset of mid abdominal pain. Abdominal mass
on ultrasound.

EXAM:
ABDOMEN - 2 VIEW

[w abdomen upright]
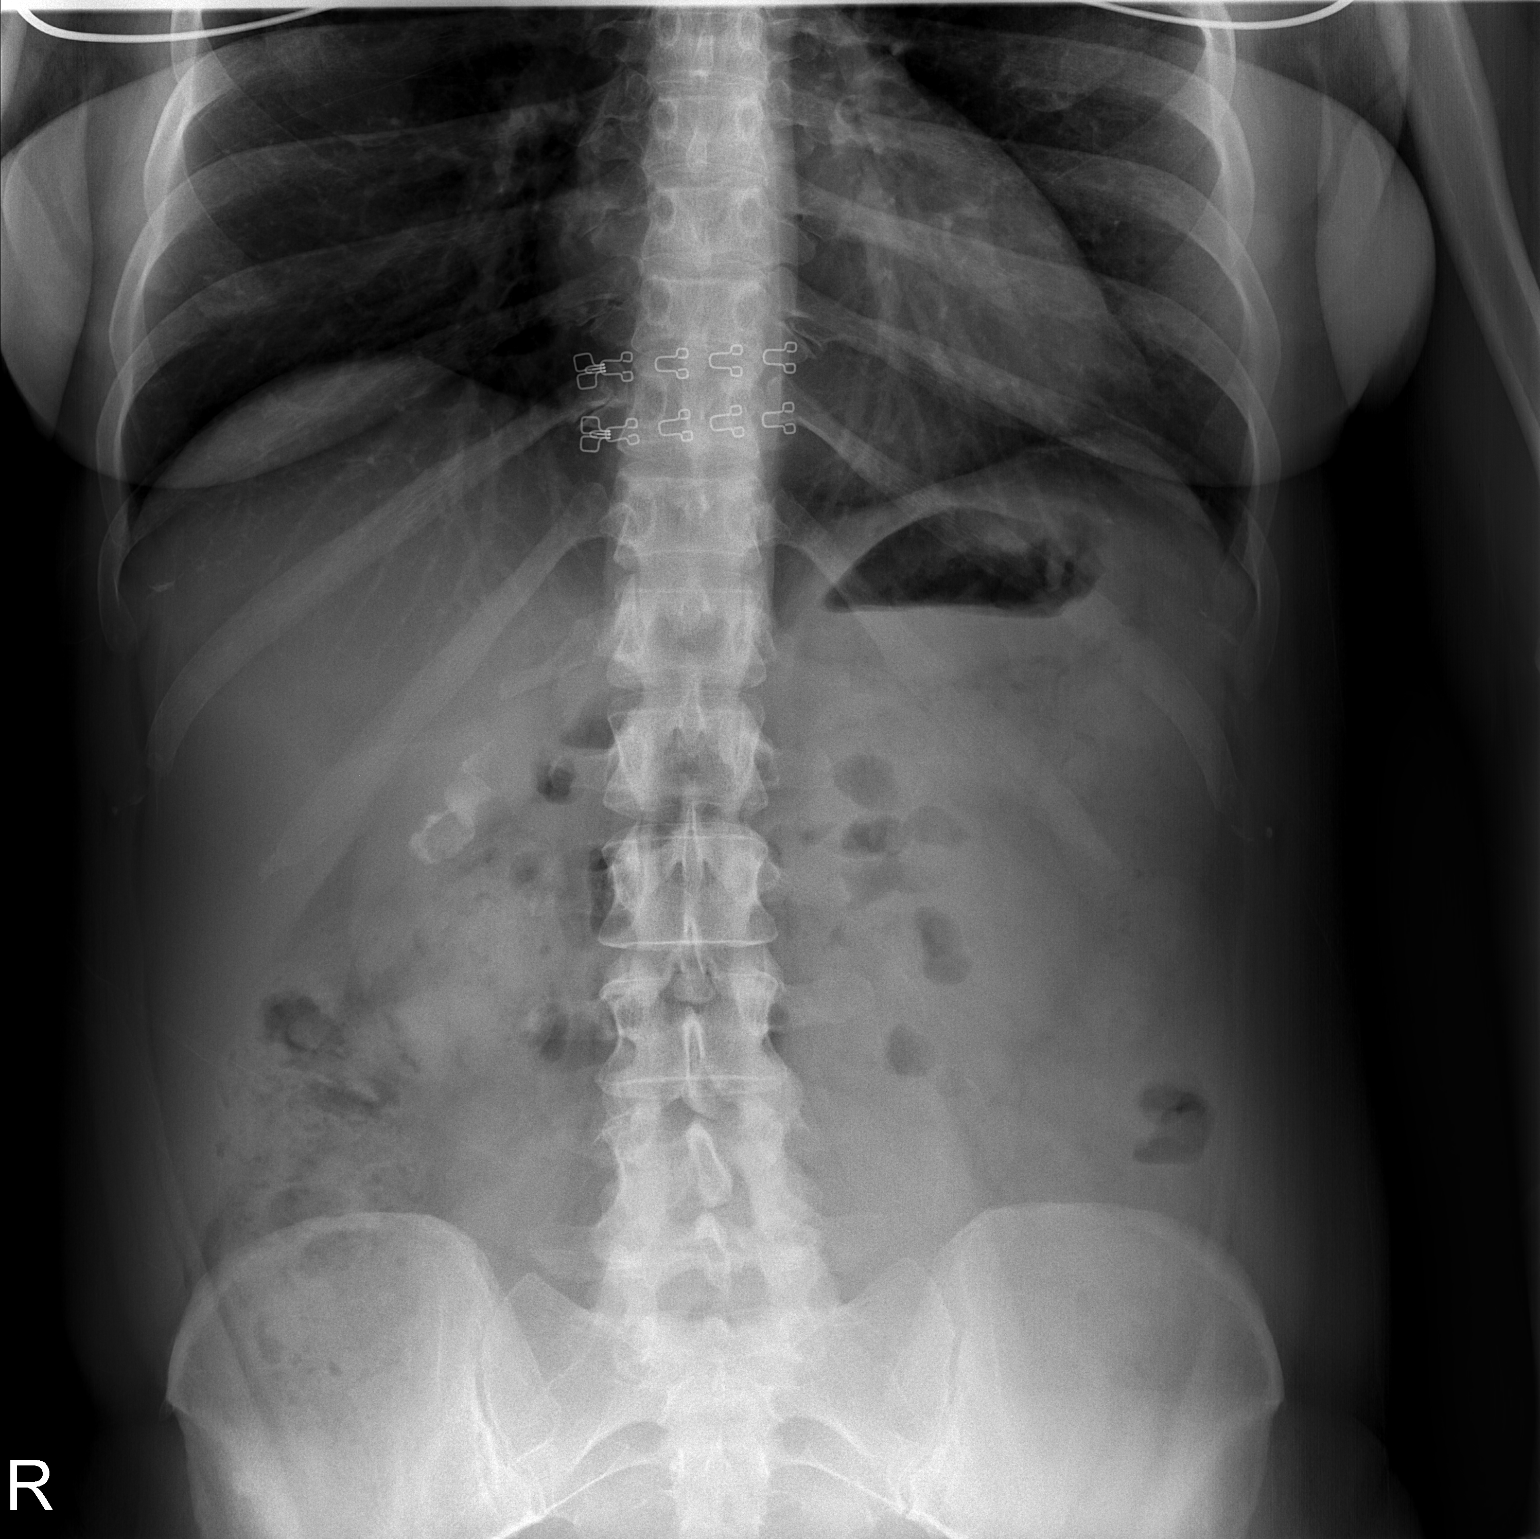

[t abdomen supine]
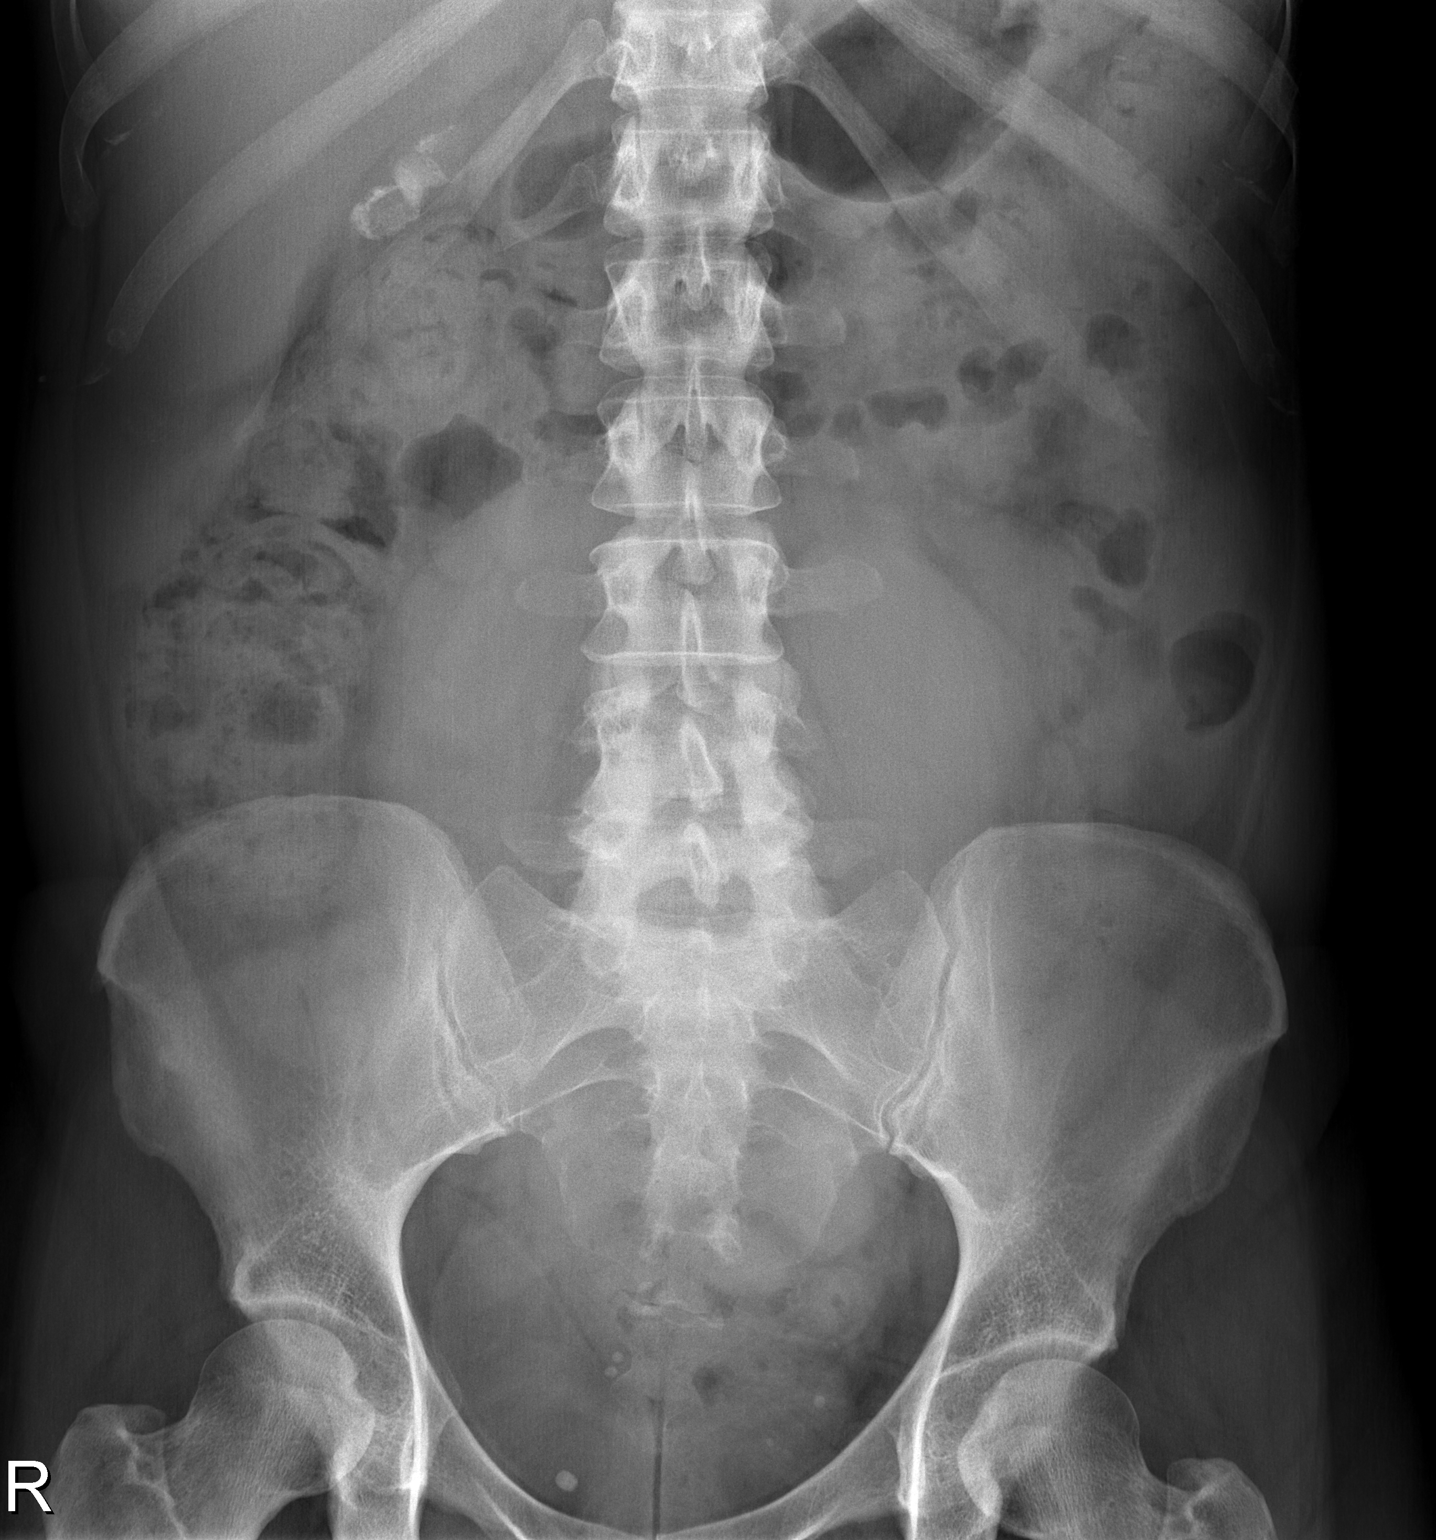

[2 of 2 positions shown; findings below may reference images not displayed]

FINDINGS: No free intra-abdominal air. No bowel dilatation to suggest
obstruction. Moderate stool in the proximal colon, small volume of
stool distally. Few adjacent rounded calcifications in the right
upper quadrant measuring up to 17 mm, suspicious for gallstones but
nonspecific. There is soft tissue fullness in the central abdomen on
the supine view. Calcifications in the pelvis are typical of
phleboliths. Lung bases are clear. No osseous abnormalities.
IMPRESSION: 1. Soft tissue fullness in the central abdomen on the supine view,
may represent reported abdominal mass, nonspecific in radiographic
appearance. Recommend further evaluation with CT.
2. Probable gallstones.

## 2021-07-06 ENCOUNTER — Encounter: Payer: Self-pay | Admitting: Family Medicine

## 2021-07-06 ENCOUNTER — Ambulatory Visit (INDEPENDENT_AMBULATORY_CARE_PROVIDER_SITE_OTHER): Payer: Self-pay | Admitting: Family Medicine

## 2021-07-06 ENCOUNTER — Other Ambulatory Visit: Payer: Self-pay

## 2021-07-06 DIAGNOSIS — D259 Leiomyoma of uterus, unspecified: Secondary | ICD-10-CM

## 2021-07-06 NOTE — Assessment & Plan Note (Signed)
Large multifibroid uterus exerting mass affect. Discussed that hysterectomy is not a procedure I perform but given 2 years of ongoing symptoms, reasonable to have consultation for this procedure. Discussed given size she will likely need a TAH. Strongly recommended she cut down or quit smoking, or switch to vaping. Encouraged her to fill out [facility] financial aid application and to pursue Medicaid enrollment. Also discussed that likely would not be able to schedule a procedure for several months given. Follow up w Gyn surgeon to discuss surgery further.

## 2021-07-06 NOTE — Progress Notes (Signed)
GYNECOLOGY OFFICE VISIT NOTE  History:   Destiny Mercado is a 45 y.o. G0P0000 here today for ED follow up of fibroid.  Patient seen at [facility] ED on 05/02/21 for abdominal pain, difficulty eating, n/v, and weight loss Symptoms are about the same, ongoing for about two years Symptoms are waxing and waning, some days no pain some days are very painful Still having difficulty keeping food down Has tried a stomach acid medication but has not helped significantly Feels like she has lost some weight since her ED visit Back surgery in 2005, no other surgeries Never been pregnant Maternal side of family has many cancers, not sure about speifics Smokes about 1 ppd of cigarettes Still has regular period, light Last pap in 2019, was normal, no hx of abnormal pap smears No insurance currently but going to get Medicaid soon  Wt Readings from Last 3 Encounters:  07/06/21 139 lb 9.6 oz (63.3 kg)  05/02/21 138 lb (62.6 kg)  04/24/20 158 lb 11.7 oz (72 kg)     Health Maintenance Due  Topic Date Due   COVID-19 Vaccine (1) Never done   Pneumococcal Vaccine 70-44 Years old (1 - PCV) Never done   HIV Screening  Never done   Hepatitis C Screening  Never done   TETANUS/TDAP  Never done   PAP SMEAR-Modifier  06/05/2021   INFLUENZA VACCINE  07/05/2021    Past Medical History:  Diagnosis Date   Fibroid    Gallstones     Past Surgical History:  Procedure Laterality Date   BACK SURGERY  2005    The following portions of the patient's history were reviewed and updated as appropriate: allergies, current medications, past family history, past medical history, past social history, past surgical history and problem list.   Health Maintenance:   Last pap: Lab Results  Component Value Date   DIAGPAP  06/05/2018    NEGATIVE FOR INTRAEPITHELIAL LESIONS OR MALIGNANCY.   HPV NOT DETECTED 06/05/2018    Last mammogram:  BIRADS-2 06/05/2018    Review of Systems:  Pertinent items  noted in HPI and remainder of comprehensive ROS otherwise negative.  Physical Exam:  BP 122/80   Pulse 90   Wt 139 lb 9.6 oz (63.3 kg)   LMP 06/29/2021 (Within Days) Comment: ended 07/05/21  BMI 22.53 kg/m  CONSTITUTIONAL: Well-developed, well-nourished female in no acute distress.  HEENT:  Normocephalic, atraumatic. External right and left ear normal. No scleral icterus.  NECK: Normal range of motion, supple, no masses noted on observation SKIN: No rash noted. Not diaphoretic. No erythema. No pallor. MUSCULOSKELETAL: Normal range of motion. No edema noted. NEUROLOGIC: Alert and oriented to person, place, and time. Normal muscle tone coordination.  PSYCHIATRIC: Normal mood and affect. Normal behavior. Normal judgment and thought content. RESPIRATORY: Effort normal, no problems with respiration noted ABDOMEN: Large misshapen mass in midline, firm, extending 5 cm above umbilicus PELVIC: Deferred  Labs and Imaging No results found for this or any previous visit (from the past 168 hour(s)). No results found.    Assessment and Plan:   Problem List Items Addressed This Visit       Genitourinary   Fibroid uterus    Large multifibroid uterus exerting mass affect. Discussed that hysterectomy is not a procedure I perform but given 2 years of ongoing symptoms, reasonable to have consultation for this procedure. Discussed given size she will likely need a TAH. Strongly recommended she cut down or quit smoking, or switch  to vaping. Encouraged her to fill out [facility] financial aid application and to pursue Medicaid enrollment. Also discussed that likely would not be able to schedule a procedure for several months given. Follow up w Gyn surgeon to discuss surgery further.        Screening for cervical cancer Due for pap, referred to [facility].   Screening for breast cancer Referred to [facility], overdue for mammo, last one normal in 2019  Routine preventative health maintenance measures  emphasized. Please refer to After Visit Summary for other counseling recommendations.   Return in about 4 weeks (around 08/03/2021) for surgical consult for hysterectomy.    Total face-to-face time with patient: 20 minutes.  Over 50% of encounter was spent on counseling and coordination of care.   Clarnce Flock, MD/MPH Attending Family Medicine Physician, [facility] for [facility], [place]

## 2021-08-04 ENCOUNTER — Other Ambulatory Visit: Payer: Self-pay

## 2021-08-04 ENCOUNTER — Ambulatory Visit (INDEPENDENT_AMBULATORY_CARE_PROVIDER_SITE_OTHER): Payer: Self-pay | Admitting: Obstetrics and Gynecology

## 2021-08-04 ENCOUNTER — Other Ambulatory Visit (HOSPITAL_COMMUNITY)
Admission: RE | Admit: 2021-08-04 | Discharge: 2021-08-04 | Disposition: A | Discharge status: Home / Self Care | Payer: Self-pay | Source: Ambulatory Visit | Attending: Obstetrics and Gynecology | Admitting: Obstetrics and Gynecology

## 2021-08-04 VITALS — BP 125/87 | HR 94 | Ht 66.0 in | Wt 138.5 lb

## 2021-08-04 DIAGNOSIS — R109 Unspecified abdominal pain: Secondary | ICD-10-CM

## 2021-08-04 DIAGNOSIS — Z720 Tobacco use: Secondary | ICD-10-CM

## 2021-08-04 MED ORDER — POLYETHYLENE GLYCOL 3350 17 G PO PACK: 17.0000 g | PACK | Freq: Two times a day (BID) | ORAL | 1 refills | Status: DC

## 2021-08-04 MED ORDER — PANTOPRAZOLE SODIUM 40 MG PO TBEC: 40.0000 mg | DELAYED_RELEASE_TABLET | Freq: Every day | ORAL | 1 refills | Status: DC

## 2021-08-04 NOTE — Progress Notes (Signed)
Obstetrics and Gynecology Return Patient Evaluation  Appointment Date: 08/05/2021  OBGYN Clinic: [facility]  Primary Care Provider: None  Referring Provider: [facility] and Dr. Dione Plover (Family Medicine)  Chief Complaint:  Chief Complaint  Patient presents with   surgery consult    History of Present Illness: Destiny Mercado is a 45 y.o. G0 (Patient's last menstrual period was 07/15/2021 (approximate).), seen for the above chief complaint. Her past medical history is significant for fibroid uterus, gallstones.  Patient presented to 5/29 ED for abdominal pain, difficulty eating and vomiting and with a known h/o fibroids and gall stones.  Gallstones again seen but no e/o infection and fibroids looked larger at that CT scan, per report, but no measurements given on the CT scan report the prior year.  This year her uterus was 21 x 14 x 16cm.  ED was wnl cbc, cmp, lipase, u/a, upt.  She had a negative BCCCP 06/05/18 pap and hpv.  She was seen by Dr. Comer Locket and she was referred to GYN surgery for today  Interval History: patient with stable s/s. +constipation, no blood in BMs  Review of Systems: Pertinent items noted in HPI and remainder of comprehensive ROS otherwise negative.    Patient Active Problem List   Diagnosis Date Noted   Tobacco abuse 08/04/2021   Fibroid uterus 07/06/2021    Past Medical History:  Past Medical History:  Diagnosis Date   Fibroid    Gallstones     Past Surgical History:  Past Surgical History:  Procedure Laterality Date   BACK SURGERY  2005    Past Obstetrical History:  OB History  Gravida Para Term Preterm AB Living  0 0 0 0 0 0  SAB IAB Ectopic Multiple Live Births  0 0 0 0 0    Past Gynecological History: As per HPI. Periods: qmonth, regular, one week, not heavy or painful Contraception: none  Social History:  Social History   Socioeconomic History   Marital status: Legally Separated     Spouse name: Not on file   Number of children: Not on file   Years of education: Not on file   Highest education level: Not on file  Occupational History   Not on file  Tobacco Use   Smoking status: Every Day    Packs/day: 0.25    Years: 20.00    Pack years: 5.00    Types: Cigarettes   Smokeless tobacco: Never  Vaping Use   Vaping Use: Never used  Substance and Sexual Activity   Alcohol use: Yes    Comment: occassionally   Drug use: Never   Sexual activity: Yes    Birth control/protection: None  Other Topics Concern   Not on file  Social History Narrative   Not on file   Social Determinants of Health   Financial Resource Strain: Not on file  Food Insecurity: Not on file  Transportation Needs: Not on file  Physical Activity: Not on file  Stress: Not on file  Social Connections: Not on file  Intimate Partner Violence: Not on file    Family History:  Family History  Problem Relation Age of Onset   Diabetes Brother    Breast cancer Maternal Grandmother     Medications Kirston B. Winslett "Brie" had no medications administered during this visit. Current Outpatient Medications  Medication Sig Dispense Refill   polyethylene glycol (MIRALAX / GLYCOLAX) 17 g packet Take 17 g by mouth 2 (two) times daily.  60 each 1   ondansetron (ZOFRAN ODT) 8 MG disintegrating tablet Take 1 tablet (8 mg total) by mouth every 8 (eight) hours as needed. (Patient not taking: Reported on 08/04/2021) 20 tablet 0   pantoprazole (PROTONIX) 40 MG tablet Take 1 tablet (40 mg total) by mouth daily. 60 tablet 1   No current facility-administered medications for this visit.    Allergies Patient has no known allergies.   Physical Exam:  BP 125/87   Pulse 94   Ht '5\' 6"'$  (1.676 m)   Wt 138 lb 8 oz (62.8 kg)   LMP 07/15/2021 (Approximate)   BMI 22.35 kg/m  Body mass index is 22.35 kg/m.  General appearance: Well nourished, well developed female in no acute distress.  Neck:  Supple, normal  appearance, and no thyromegaly  Cardiovascular: normal s1 and s2.  No murmurs, rubs or gallops. Respiratory:  Clear to auscultation bilateral. Normal respiratory effort Abdomen: mass to [id number] above the umbilicus, see below. Negative murphy's Neuro/Psych:  Normal mood and affect.  Skin:  Warm and dry.  Lymphatic:  No inguinal lymphadenopathy.   Pelvic exam: is not limited by body habitus EGBUS: within normal limits Vagina: within normal limits and with no blood or discharge in the vault Cervix: normal appearing cervix without tenderness, discharge or lesions. Uterus:  24wks sized, approximately 2-3cm lateral from the umbilicus bilaterally, somewhat mobile, ttp Adnexa:  normal adnexa and no mass, fullness, tenderness Rectovaginal: deferred  Laboratory: as per hpi  Radiology:  Narrative & Impression  CLINICAL DATA:  Vomiting for 3 weeks, right upper quadrant pain, history of cholelithiasis, history of uterine fibroids   EXAM: CT ABDOMEN AND PELVIS WITH CONTRAST   TECHNIQUE: Multidetector CT imaging of the abdomen and pelvis was performed using the standard protocol following bolus administration of intravenous contrast.   CONTRAST:  150m OMNIPAQUE IOHEXOL 300 MG/ML  SOLN   COMPARISON:  04/24/2020   FINDINGS: Lower chest: No acute pleural or parenchymal lung disease.   Hepatobiliary: Gallbladder is decompressed, with multiple calcified gallstones identified within the gallbladder neck. No gallbladder wall thickening to suggest cholecystitis. The liver is unremarkable. No biliary dilation.   Pancreas: Unremarkable. No pancreatic ductal dilatation or surrounding inflammatory changes.   Spleen: Normal in size without focal abnormality.   Adrenals/Urinary Tract: The kidneys enhance normally and symmetrically. No urinary tract calculi or obstruction. The adrenals are unremarkable. Bladder is decompressed, limiting its evaluation.   Stomach/Bowel: No bowel obstruction or  ileus. Normal appendix right lower quadrant. No bowel wall thickening or inflammatory change.   Vascular/Lymphatic: No significant vascular findings are present. No enlarged abdominal or pelvic lymph nodes.   Reproductive: There is massive heterogeneous enlargement of the uterus consistent with multiple fibroids. Liver measures approximately 21 x 14 x 16 cm, with numerous intramural and subserosal fibroids. Age-appropriate appearance of the ovaries.   Other: There is trace free fluid within the cul-de-sac, likely physiologic. No free intraperitoneal gas. There is a small fat containing umbilical hernia. No bowel herniation.   Musculoskeletal: No acute or destructive bony lesions. Reconstructed images demonstrate no additional findings.   IMPRESSION: 1. Cholelithiasis without evidence of cholecystitis. 2. Large fibroid uterus, increased in size since prior study. 3. Trace pelvic free fluid, likely physiologic. 4. Small fat containing umbilical hernia.     Electronically Signed   By: MRanda NgoM.D.   On: 05/02/2021 03:44    Assessment: pt stable  Plan:  1. Tobacco abuse  2. Abdominal pain, unspecified abdominal location Patient  doesn't have insurance. She has lost 20lbs since last year and her main complication is not being able to eat and drink much. I told her that I don't believe she is having issues from her gallbladder and if she is still having some s/s later that we can have gen surg eval after the hyst but I told her that given her bulk s/s and size of the uterus that a hysterectomy is her only real option even though it would be a difficult surgery and high risk for need for blood, infection, damage to surrounding organs. Pt is amaneable to proceeding with TAH/BS. Request sent to surgery - Cervicovaginal ancillary only( [place])  RTC post op  Durene Romans MD Attending Center for [facility])

## 2021-08-04 NOTE — Progress Notes (Signed)
Patient in today for surgery consult. States the last 3 weeks she is experiencing extreme pain in her lower abdomen and goes into her back. States when she lays down that her stomach "feels like it is contracting". Taking Tylenol for pain but having no relief, using a heating pad that helps very minimally.   Altha Harm, CMA

## 2021-08-05 LAB — CERVICOVAGINAL ANCILLARY ONLY
Bacterial Vaginitis (gardnerella): POSITIVE — AB
Candida Glabrata: NEGATIVE
Candida Vaginitis: NEGATIVE
Chlamydia: NEGATIVE
Comment: NEGATIVE
Comment: NEGATIVE
Comment: NEGATIVE
Comment: NEGATIVE
Comment: NEGATIVE
Comment: NORMAL
Neisseria Gonorrhea: NEGATIVE
Trichomonas: NEGATIVE

## 2021-08-05 MED ORDER — METRONIDAZOLE 500 MG PO TABS: 500.0000 mg | ORAL_TABLET | Freq: Two times a day (BID) | ORAL | 0 refills | Status: AC

## 2021-08-05 NOTE — Addendum Note (Signed)
Addended by: Aletha Halim on: 08/05/2021 01:35 PM   Modules accepted: Orders

## 2021-08-12 ENCOUNTER — Telehealth: Payer: Self-pay

## 2021-08-12 NOTE — Telephone Encounter (Signed)
Telephoned patient at home number. Left a voice message with BCCCP contact information. 

## 2021-08-24 ENCOUNTER — Telehealth: Payer: Self-pay | Admitting: *Deleted

## 2021-08-24 NOTE — Telephone Encounter (Signed)
Call to patient ( see My Chart message) regarding surgery dates. Left message to call back to confirm date options and insurance.

## 2021-08-25 NOTE — Telephone Encounter (Signed)
See phone calls to patient.

## 2021-08-25 NOTE — Telephone Encounter (Signed)
Call to patient approximately 1230 to follow-up on request for surgery. Patient called back approximately 1250- states she has not received any response from [facility] and has not applied for FA since she does not have any documentation for other assistance. Patient will call back to schedule once has FA or insurance in place.  Physician notified. Encounter closed.

## 2021-09-14 ENCOUNTER — Inpatient Hospital Stay: Admit: 2021-09-14 | Payer: Self-pay | Admitting: Obstetrics and Gynecology

## 2021-09-14 SURGERY — HYSTERECTOMY, TOTAL, ABDOMINAL, WITH SALPINGECTOMY
Anesthesia: Choice | Laterality: Bilateral

## 2022-01-13 ENCOUNTER — Encounter: Payer: Self-pay | Admitting: Obstetrics and Gynecology

## 2022-01-13 MED ORDER — PANTOPRAZOLE SODIUM 40 MG PO TBEC: 40.0000 mg | DELAYED_RELEASE_TABLET | Freq: Every day | ORAL | 0 refills | Status: DC

## 2022-01-13 MED ORDER — ONDANSETRON 8 MG PO TBDP: 8.0000 mg | ORAL_TABLET | Freq: Three times a day (TID) | ORAL | 0 refills | Status: DC | PRN

## 2022-04-13 ENCOUNTER — Encounter (HOSPITAL_COMMUNITY): Payer: Self-pay | Admitting: *Deleted

## 2022-04-13 ENCOUNTER — Emergency Department (HOSPITAL_COMMUNITY)
Admission: EM | Admit: 2022-04-13 | Discharge: 2022-04-13 | Discharge status: Against Medical Advice | Payer: Self-pay | Attending: Emergency Medicine | Admitting: Emergency Medicine

## 2022-04-13 ENCOUNTER — Other Ambulatory Visit: Payer: Self-pay

## 2022-04-13 DIAGNOSIS — R109 Unspecified abdominal pain: Secondary | ICD-10-CM | POA: Insufficient documentation

## 2022-04-13 DIAGNOSIS — R079 Chest pain, unspecified: Secondary | ICD-10-CM | POA: Insufficient documentation

## 2022-04-13 DIAGNOSIS — Z5321 Procedure and treatment not carried out due to patient leaving prior to being seen by health care provider: Secondary | ICD-10-CM | POA: Insufficient documentation

## 2022-04-13 DIAGNOSIS — R1110 Vomiting, unspecified: Secondary | ICD-10-CM | POA: Insufficient documentation

## 2022-04-13 DIAGNOSIS — Z8719 Personal history of other diseases of the digestive system: Secondary | ICD-10-CM | POA: Insufficient documentation

## 2022-04-13 LAB — COMPREHENSIVE METABOLIC PANEL
ALT: 12 U/L (ref 0–44)
AST: 16 U/L (ref 15–41)
Albumin: 4.6 g/dL (ref 3.5–5.0)
Alkaline Phosphatase: 55 U/L (ref 38–126)
Anion gap: 9 (ref 5–15)
BUN: 11 mg/dL (ref 6–20)
CO2: 25 mmol/L (ref 22–32)
Calcium: 9.4 mg/dL (ref 8.9–10.3)
Chloride: 106 mmol/L (ref 98–111)
Creatinine, Ser: 0.78 mg/dL (ref 0.44–1.00)
GFR, Estimated: >60 mL/min (ref >60)
Glucose, Bld: 122 mg/dL — ABNORMAL HIGH (ref 70–99)
Potassium: 3.9 mmol/L (ref 3.5–5.1)
Sodium: 140 mmol/L (ref 135–145)
Total Bilirubin: 1 mg/dL (ref 0.3–1.2)
Total Protein: 8.1 g/dL (ref 6.5–8.1)

## 2022-04-13 LAB — CBC
HCT: 48.9 % — ABNORMAL HIGH (ref 36.0–46.0)
Hemoglobin: 17.4 g/dL — ABNORMAL HIGH (ref 12.0–15.0)
MCH: 35.7 pg — ABNORMAL HIGH (ref 26.0–34.0)
MCHC: 35.6 g/dL (ref 30.0–36.0)
MCV: 100.4 fL — ABNORMAL HIGH (ref 80.0–100.0)
Platelets: 265 10*3/uL (ref 150–400)
RBC: 4.87 MIL/uL (ref 3.87–5.11)
RDW: 13.3 % (ref 11.5–15.5)
WBC: 9.7 10*3/uL (ref 4.0–10.5)
nRBC: 0 % (ref 0.0–0.2)

## 2022-04-13 LAB — I-STAT BETA HCG BLOOD, ED (MC, WL, AP ONLY): I-stat hCG, quantitative: <5 m[IU]/mL (ref <5)

## 2022-04-13 LAB — LIPASE, BLOOD: Lipase: 25 U/L (ref 11–51)

## 2022-04-13 MED ORDER — SODIUM CHLORIDE 0.9 % IV BOLUS: 1000.0000 mL | Freq: Once | INTRAVENOUS | Status: DC

## 2022-04-13 MED ORDER — ONDANSETRON HCL 4 MG/2ML IJ SOLN: 4.0000 mg | Freq: Once | INTRAMUSCULAR | Status: DC

## 2022-04-13 NOTE — ED Provider Triage Note (Signed)
Emergency Medicine Provider Triage Evaluation Note ? ?Scarlette Shorts , a 46 y.o. female  was evaluated in triage.  Pt complains of abdominal pain and vomiting ? ?Review of Systems  ?Positive: vomiting ?Negative: No fever ? ?Physical Exam  ?BP 117/86 (BP Location: Left Arm)   Pulse 81   Temp 98 ?F (36.7 ?C) (Oral)   Resp 18   Ht '5\' 6"'$  (1.676 m)   Wt 61.2 kg   SpO2 97%   BMI 21.79 kg/m?  ?Gen:   Awake, no distress   ?Resp:  Normal effort  ?MSK:   Moves extremities without difficulty  ?Other:   ? ?Medical Decision Making  ?Medically screening exam initiated at 8:59 AM.  Appropriate orders placed.  Seema B Langone was informed that the remainder of the evaluation will be completed by another provider, this initial triage assessment does not replace that evaluation, and the importance of remaining in the ED until their evaluation is complete. ? ? ?  ?Fransico Meadow, [place] ?04/13/22 0901 ? ?

## 2022-04-13 NOTE — ED Notes (Signed)
No answer from pt in lobby when called for room x 1.  ?

## 2022-04-13 NOTE — ED Triage Notes (Signed)
Abd apin with N/V yesterday then chest pain. Reports history of Gall Stones ?

## 2022-04-13 NOTE — ED Notes (Signed)
No answer from pt when called for room x 2.  

## 2022-04-18 ENCOUNTER — Emergency Department (HOSPITAL_BASED_OUTPATIENT_CLINIC_OR_DEPARTMENT_OTHER): Payer: Self-pay

## 2022-04-18 ENCOUNTER — Emergency Department (HOSPITAL_BASED_OUTPATIENT_CLINIC_OR_DEPARTMENT_OTHER)
Admission: EM | Admit: 2022-04-18 | Discharge: 2022-04-18 | Disposition: A | Discharge status: Home / Self Care | Payer: Self-pay | Attending: Emergency Medicine | Admitting: Emergency Medicine

## 2022-04-18 ENCOUNTER — Other Ambulatory Visit: Payer: Self-pay

## 2022-04-18 ENCOUNTER — Encounter (HOSPITAL_BASED_OUTPATIENT_CLINIC_OR_DEPARTMENT_OTHER): Payer: Self-pay | Admitting: Emergency Medicine

## 2022-04-18 DIAGNOSIS — K8020 Calculus of gallbladder without cholecystitis without obstruction: Secondary | ICD-10-CM | POA: Insufficient documentation

## 2022-04-18 DIAGNOSIS — D219 Benign neoplasm of connective and other soft tissue, unspecified: Secondary | ICD-10-CM

## 2022-04-18 DIAGNOSIS — D259 Leiomyoma of uterus, unspecified: Secondary | ICD-10-CM | POA: Insufficient documentation

## 2022-04-18 LAB — COMPREHENSIVE METABOLIC PANEL
ALT: 20 U/L (ref 0–44)
AST: 20 U/L (ref 15–41)
Albumin: 4.9 g/dL (ref 3.5–5.0)
Alkaline Phosphatase: 59 U/L (ref 38–126)
Anion gap: 14 (ref 5–15)
BUN: 8 mg/dL (ref 6–20)
CO2: 28 mmol/L (ref 22–32)
Calcium: 10 mg/dL (ref 8.9–10.3)
Chloride: 91 mmol/L — ABNORMAL LOW (ref 98–111)
Creatinine, Ser: 0.83 mg/dL (ref 0.44–1.00)
GFR, Estimated: >60 mL/min (ref >60)
Glucose, Bld: 108 mg/dL — ABNORMAL HIGH (ref 70–99)
Potassium: 3 mmol/L — ABNORMAL LOW (ref 3.5–5.1)
Sodium: 133 mmol/L — ABNORMAL LOW (ref 135–145)
Total Bilirubin: 1 mg/dL (ref 0.3–1.2)
Total Protein: 8.5 g/dL — ABNORMAL HIGH (ref 6.5–8.1)

## 2022-04-18 LAB — URINALYSIS, MICROSCOPIC (REFLEX)

## 2022-04-18 LAB — CBC
HCT: 48.9 % — ABNORMAL HIGH (ref 36.0–46.0)
Hemoglobin: 17.9 g/dL — ABNORMAL HIGH (ref 12.0–15.0)
MCH: 34.8 pg — ABNORMAL HIGH (ref 26.0–34.0)
MCHC: 36.6 g/dL — ABNORMAL HIGH (ref 30.0–36.0)
MCV: 95.1 fL (ref 80.0–100.0)
Platelets: 260 10*3/uL (ref 150–400)
RBC: 5.14 MIL/uL — ABNORMAL HIGH (ref 3.87–5.11)
RDW: 12.7 % (ref 11.5–15.5)
WBC: 7 10*3/uL (ref 4.0–10.5)
nRBC: 0 % (ref 0.0–0.2)

## 2022-04-18 LAB — LIPASE, BLOOD: Lipase: 26 U/L (ref 11–51)

## 2022-04-18 LAB — URINALYSIS, ROUTINE W REFLEX MICROSCOPIC
Bilirubin Urine: NEGATIVE
Glucose, UA: NEGATIVE mg/dL
Ketones, ur: NEGATIVE mg/dL
Leukocytes,Ua: NEGATIVE
Nitrite: NEGATIVE
Protein, ur: NEGATIVE mg/dL
Specific Gravity, Urine: 1.015 (ref 1.005–1.030)
pH: 7 (ref 5.0–8.0)

## 2022-04-18 LAB — PREGNANCY, URINE: Preg Test, Ur: NEGATIVE

## 2022-04-18 MED ORDER — PANTOPRAZOLE SODIUM 40 MG PO TBEC
40.0000 mg | DELAYED_RELEASE_TABLET | Freq: Every day | ORAL | 0 refills | Status: AC
Start: 2022-04-18 — End: ?

## 2022-04-18 MED ORDER — POTASSIUM CHLORIDE CRYS ER 20 MEQ PO TBCR
40.0000 meq | EXTENDED_RELEASE_TABLET | Freq: Once | ORAL | Status: AC
  Administered 2022-04-18: 40 meq via ORAL
  Filled 2022-04-18: qty 2

## 2022-04-18 MED ORDER — PANTOPRAZOLE SODIUM 40 MG IV SOLR
INTRAVENOUS | Status: AC
  Filled 2022-04-18: qty 20

## 2022-04-18 MED ORDER — IOHEXOL 300 MG/ML  SOLN
100.0000 mL | Freq: Once | INTRAMUSCULAR | Status: AC | PRN
  Administered 2022-04-18: 100 mL via INTRAVENOUS

## 2022-04-18 MED ORDER — ONDANSETRON HCL 4 MG/2ML IJ SOLN
4.0000 mg | Freq: Once | INTRAMUSCULAR | Status: AC | PRN
  Administered 2022-04-18: 4 mg via INTRAVENOUS
  Filled 2022-04-18: qty 2

## 2022-04-18 MED ORDER — HYDROMORPHONE HCL 1 MG/ML IJ SOLN
1.0000 mg | Freq: Once | INTRAMUSCULAR | Status: AC
  Administered 2022-04-18: 1 mg via INTRAVENOUS
  Filled 2022-04-18: qty 1

## 2022-04-18 MED ORDER — PANTOPRAZOLE 80MG IVPB - SIMPLE MED
80.0000 mg | Freq: Once | INTRAVENOUS | Status: AC
  Administered 2022-04-18: 80 mg via INTRAVENOUS
  Filled 2022-04-18: qty 100

## 2022-04-18 MED ORDER — IBUPROFEN 600 MG PO TABS: 600.0000 mg | ORAL_TABLET | Freq: Four times a day (QID) | ORAL | 0 refills | Status: AC | PRN

## 2022-04-18 MED ORDER — SODIUM CHLORIDE 0.9 % IV BOLUS
1000.0000 mL | Freq: Once | INTRAVENOUS | Status: AC
  Administered 2022-04-18: 1000 mL via INTRAVENOUS

## 2022-04-18 MED ORDER — ONDANSETRON 8 MG PO TBDP: 8.0000 mg | ORAL_TABLET | Freq: Three times a day (TID) | ORAL | 0 refills | Status: AC | PRN

## 2022-04-18 MED ORDER — LACTATED RINGERS IV BOLUS
1000.0000 mL | Freq: Once | INTRAVENOUS | Status: AC
Start: 2022-04-18 — End: 2022-04-18
  Administered 2022-04-18: 1000 mL via INTRAVENOUS

## 2022-04-18 MED ORDER — OXYCODONE-ACETAMINOPHEN 5-325 MG PO TABS
1.0000 | ORAL_TABLET | Freq: Three times a day (TID) | ORAL | 0 refills | Status: AC | PRN
Start: 2022-04-18 — End: ?

## 2022-04-18 NOTE — ED Notes (Signed)
Informed that urine spec is requested, states she is unable to void at this time ?

## 2022-04-18 NOTE — ED Provider Notes (Signed)
46 year old lady presents to ER due to concern for abdominal discomfort.  History of large fibroid uterus.  CT today concerning for interval growth of the enlarged fibroid uterus.  Case management and GYN consulted.  Awaiting callback from GYN and case management.  I discussed the case with Dr. Elonda Husky.  He reviewed the CT, he will help make arrangements for patient to follow-up back in clinic, he is hopeful that she will qualify for a financial assistance program.  Case management has arranged for PCP appointment.  I have asked case management to look into the possibility of financial assistance for hysterectomy.  Reassessed patient, she is well-appearing, minimal ongoing symptoms, states that she is feeling very well, discharge. ?  ?Lucrezia Starch, MD ?04/18/22 1631 ? ?

## 2022-04-18 NOTE — Progress Notes (Signed)
Transition of Care [facility]) - Emergency Department Mini Assessment ? ? ?Patient Details  ?Name: Destiny Mercado ?MRN: [id number] ?Date of Birth: 1976/06/09 ? ?Transition of Care (TOC) CM/SW Contact:    ?Roseanne Kaufman, RN ?Phone Number: ?04/18/2022, 3:45 PM ? ? ?Clinical Narrative: ?Patient presents to [facility] ED for left upper quad pain with vomitting x one week. RNCM received TOC consult for PCP needs. ? ? ?ED Mini Assessment: ?RNCM spoke with patient via phone. Patient reports this is her 3rd ED visit and needs abdominal hysterectomy, due to unable to eat. Patient has no medical insurance. Patient works however is available anytime on Mondays.   ? ?RNCM scheduled a PCP appointment for Monday 04/25/22 at 1:15 pm with [facility]. Patient notified and also attached to AVS. ? ?No additional TOC needs ? ? Patient Goals and CMS Choice ?Patient states their goals for this ED visit and ongoing recovery are:: Patient wants to PCP established and go home  ? ?  ?Patient Contact and Communications ?  ? Patient: [phone] [facility] Phone)  ?  ? ?Admission diagnosis:  severe abd pain and lack of appitite ?Patient Active Problem List  ? Diagnosis Date Noted  ? Tobacco abuse 08/04/2021  ? Fibroid uterus 07/06/2021  ? ?PCP:  Pcp, No ?Pharmacy:   ?[id number] [id number]?Phone: [phone]: [phone]?  ?

## 2022-04-18 NOTE — ED Provider Notes (Signed)
?[facility] EMERGENCY DEPARTMENT ?Provider Note ? ? ?CSN: [id number] ?Arrival date & time: 04/18/22  1217 ? ?  ? ?History ? ?Chief Complaint  ?Patient presents with  ? Abdominal Pain  ? Emesis  ? ? ?Destiny Mercado is a 46 y.o. female. ? ?HPI ? ?  ? ?46 year old female comes in with chief complaint of abdominal pain and vomiting. ? ?Patient indicates that she has been having left-sided flank pain, left upper quadrant abdominal pain for the last week.  She has also been having vomiting for the last week.  She has history of gallstones and fibroid, and in the past both have caused pain that is similar. ? ?Patient denies any associated fevers or chills.  Pain became worse over the weekend, along with vomiting, therefore she decided to come to the ER.  Patient denies any diarrhea. ? ?Patient has known history of gallstones and large fibroid, she unfortunately has had lack of insurance which has prevented follow-up.  She has been recommended to have cholecystectomy and also hysterectomy. ? ?Home Medications ?Prior to Admission medications   ?Medication Sig Start Date End Date Taking? Authorizing Provider  ?ibuprofen (ADVIL) 600 MG tablet Take 1 tablet (600 mg total) by mouth every 6 (six) hours as needed. 04/18/22  Yes Varney Biles, MD  ?ondansetron (ZOFRAN-ODT) 8 MG disintegrating tablet Take 1 tablet (8 mg total) by mouth every 8 (eight) hours as needed for nausea. 04/18/22  Yes Varney Biles, MD  ?oxyCODONE-acetaminophen (PERCOCET/ROXICET) 5-325 MG tablet Take 1 tablet by mouth every 8 (eight) hours as needed for severe pain. 04/18/22  Yes Varney Biles, MD  ?pantoprazole (PROTONIX) 40 MG tablet Take 1 tablet (40 mg total) by mouth daily. 04/18/22   Lucrezia Starch, MD  ?polyethylene glycol (MIRALAX / GLYCOLAX) 17 g packet Take 17 g by mouth 2 (two) times daily. 08/04/21   Aletha Halim, MD  ?   ? ?Allergies    ?Patient has no known allergies.   ? ?Review of Systems   ?Review of Systems  ?All other  systems reviewed and are negative. ? ?Physical Exam ?Updated Vital Signs ?BP 129/76   Pulse 81   Temp 97.6 ?F (36.4 ?C) (Oral)   Resp 17   Ht '5\' 6"'$  (1.676 m)   Wt 58.1 kg   LMP 04/10/2022   SpO2 99%   BMI 20.68 kg/m?  ?Physical Exam ?Vitals and nursing note reviewed.  ?Constitutional:   ?   Appearance: She is well-developed.  ?HENT:  ?   Head: Atraumatic.  ?Cardiovascular:  ?   Rate and Rhythm: Normal rate.  ?Pulmonary:  ?   Effort: Pulmonary effort is normal.  ?Abdominal:  ?   Tenderness: There is abdominal tenderness in the periumbilical area, left upper quadrant and left lower quadrant.  ?Musculoskeletal:  ?   Cervical back: Normal range of motion and neck supple.  ?Skin: ?   General: Skin is warm and dry.  ?Neurological:  ?   Mental Status: She is alert and oriented to person, place, and time.  ? ? ?ED Results / Procedures / Treatments   ?Labs ?(all labs ordered are listed, but only abnormal results are displayed) ?Labs Reviewed  ?COMPREHENSIVE METABOLIC PANEL - Abnormal; Notable for the following components:  ?    Result Value  ? Sodium 133 (*)   ? Potassium 3.0 (*)   ? Chloride 91 (*)   ? Glucose, Bld 108 (*)   ? Total Protein 8.5 (*)   ? All  other components within normal limits  ?CBC - Abnormal; Notable for the following components:  ? RBC 5.14 (*)   ? Hemoglobin 17.9 (*)   ? HCT 48.9 (*)   ? MCH 34.8 (*)   ? MCHC 36.6 (*)   ? All other components within normal limits  ?URINALYSIS, ROUTINE W REFLEX MICROSCOPIC - Abnormal; Notable for the following components:  ? Hgb urine dipstick TRACE (*)   ? All other components within normal limits  ?URINALYSIS, MICROSCOPIC (REFLEX) - Abnormal; Notable for the following components:  ? Bacteria, UA RARE (*)   ? All other components within normal limits  ?LIPASE, BLOOD  ?PREGNANCY, URINE  ? ? ?EKG ?None ? ?Radiology ?CT ABDOMEN PELVIS W CONTRAST ? ?Result Date: 04/18/2022 ?CLINICAL DATA:  Abdominal pain EXAM: CT ABDOMEN AND PELVIS WITH CONTRAST TECHNIQUE:  Multidetector CT imaging of the abdomen and pelvis was performed using the standard protocol following bolus administration of intravenous contrast. RADIATION DOSE REDUCTION: This exam was performed according to the departmental dose-optimization program which includes automated exposure control, adjustment of the mA and/or kV according to patient size and/or use of iterative reconstruction technique. CONTRAST:  150m OMNIPAQUE IOHEXOL 300 MG/ML  SOLN COMPARISON:  None Available. FINDINGS: Lower chest: No acute abnormality. Hepatobiliary: No focal liver abnormality. Gallbladder is contracted with several stones again identified near the neck no biliary dilatation. Pancreas: Unremarkable. No pancreatic ductal dilatation or surrounding inflammatory changes. Spleen: Normal in size without focal abnormality. Adrenals/Urinary Tract: Adrenals and kidneys are unremarkable. The bladder is poorly distended and not well evaluated. Stomach/Bowel: Stomach is within normal limits. Bowel is normal in caliber. Loops are displaced by the large uterus. Vascular/Lymphatic: No significant vascular abnormality. No enlarged nodes. Reproductive: Marked enlargement of the uterus with numerous fibroids. Interval fibroid growth toward the right upper quadrant with an area of cystic degeneration. Additional areas of cystic degeneration are also noted. No adnexal mass. Other: No free fluid.  Abdominal wall is unremarkable. Musculoskeletal: Degenerative changes of the lower lumbar spine. No acute osseous abnormality. IMPRESSION: Markedly enlarged, fibroid uterus. Interval growth toward the right upper quadrant and new areas of cystic degeneration. Similar appearance of stones near the gallbladder neck. Electronically Signed   By: PMacy MisM.D.   On: 04/18/2022 15:19   ? ?Procedures ?Procedures  ? ? ?Medications Ordered in ED ?Medications  ?ondansetron (ZOFRAN) injection 4 mg (4 mg Intravenous Given 04/18/22 1254)  ?sodium chloride 0.9 %  bolus 1,000 mL (0 mLs Intravenous Stopped 04/18/22 1354)  ?pantoprazole (PROTONIX) 80 mg /NS 100 mL IVPB (0 mg Intravenous Stopped 04/18/22 1550)  ?HYDROmorphone (DILAUDID) injection 1 mg (1 mg Intravenous Given 04/18/22 1438)  ?lactated ringers bolus 1,000 mL (0 mLs Intravenous Stopped 04/18/22 1639)  ?iohexol (OMNIPAQUE) 300 MG/ML solution 100 mL (100 mLs Intravenous Contrast Given 04/18/22 1452)  ?ondansetron (ZOFRAN) injection 4 mg (4 mg Intravenous Given 04/18/22 1446)  ?pantoprazole (PROTONIX) 40 MG injection (  Given 04/18/22 1505)  ?potassium chloride SA (KLOR-CON M) CR tablet 40 mEq (40 mEq Oral Given 04/18/22 1637)  ? ? ?ED Course/ Medical Decision Making/ A&P ?Clinical Course as of 04/19/22 1643  ?Mon Apr 18, 2022  ?1537 Lipase: 26 ?Lipase is normal along with complete metabolic profile which reveals no elevation of LFTs. [AN]  ?1537 WBC: 7.0 ?White count is normal. [AN]  ?1[place]?CT visualized and interpreted independently.  Large cystic mass appreciated. ? ?Radiology read indicates that the mass has gotten larger.  No necrosis  seen.  She also has gallstones, but clinically, that does not appear to be the root cause. [AN]  ?[facility] work consulted at this time to see if they can help with follow-up. [AN]  ?  ?Clinical Course User Index ?[AN] Varney Biles, MD  ? ?                        ?Medical Decision Making ?Amount and/or Complexity of Data Reviewed ?Labs: ordered. Decision-making details documented in ED Course. ?Radiology: ordered. Decision-making details documented in ED Course. ? ?Risk ?Prescription drug management. ? ? ?This patient presents to the ED with chief complaint(s) of abdominal pain with pertinent past medical history of cholelithiasis and large fibroid which further complicates the presenting complaint. The complaint involves an extensive differential diagnosis and also carries with it a high risk of complications and morbidity.   ? ?The differential diagnosis  includes : ?Pancreatitis, gastritis, gallstone pancreatitis, fibroid related pain. ? ?On exam, patient has epigastric pain that is radiating to back, therefore gastritis and pancreatitis are high in the differential.  Gardiner Sleeper

## 2022-04-18 NOTE — ED Notes (Signed)
Pt unable to provide urine at this time, RN Legrand Como has been informed. ?

## 2022-04-18 NOTE — Discharge Instructions (Signed)
Follow-up with gynecology as well as primary care.  If you do not hear a call from the gynecology office, please call their clinic to request an appointment.  Come back to ER as needed for worsening pain, vomiting, fever or other new concerning symptom. ?

## 2022-04-18 NOTE — ED Notes (Signed)
Presents with abd pain, unable to eat, is very nauseated. Pain is diffuse across abdomen, tender to palpation in all quads. Noted to be retching intermittently ?

## 2022-04-18 NOTE — ED Triage Notes (Signed)
Left flank and luq pain x 1 week. Vomiting x 1 week and has not eaten. Pt states she has a fibroid and gall stones that are also causing pain.  ?

## 2022-04-18 NOTE — ED Notes (Signed)
ED Provider at bedside. 

## 2022-04-25 ENCOUNTER — Encounter: Payer: Self-pay | Admitting: Internal Medicine

## 2022-04-25 NOTE — Patient Instructions (Incomplete)
[name], it was a pleasure seeing you today!  Today we discussed:  Fibroids: Please reach out to the OB/GYN office to arrange follow up. Their number is [phone].    Follow-up: {CHL AMB PED TIME TO FOLLOW-[phone]}   Please make sure to arrive 15 minutes prior to your next appointment. If you arrive late, you may be asked to reschedule.   We look forward to seeing you next time. Please call our clinic at [phone] if you have any questions or concerns. The best time to call is Monday-Friday from 9am-4pm, but there is someone available 24/7. If after hours or the weekend, call the main hospital number and ask for the Internal Medicine Resident On-Call. If you need medication refills, please notify your pharmacy one week in advance and they will send [place] a request.  Thank you for letting us take part in your care. Wishing you the best!

## 2022-05-10 ENCOUNTER — Telehealth: Payer: Self-pay | Admitting: *Deleted

## 2022-05-10 ENCOUNTER — Encounter: Payer: Self-pay | Admitting: Internal Medicine

## 2022-05-10 NOTE — Telephone Encounter (Signed)
Called patient regarding her missed appointment / left voice message for patient to call the main number at [phone] to reschedule the appointment.

## 2024-10-26 ENCOUNTER — Emergency Department (HOSPITAL_BASED_OUTPATIENT_CLINIC_OR_DEPARTMENT_OTHER)
Admission: EM | Admit: 2024-10-26 | Discharge: 2024-10-26 | Disposition: A | Discharge status: Home / Self Care | Payer: Self-pay | Attending: Emergency Medicine | Admitting: Emergency Medicine

## 2024-10-26 ENCOUNTER — Other Ambulatory Visit: Payer: Self-pay

## 2024-10-26 ENCOUNTER — Emergency Department (HOSPITAL_BASED_OUTPATIENT_CLINIC_OR_DEPARTMENT_OTHER): Payer: Self-pay

## 2024-10-26 ENCOUNTER — Encounter (HOSPITAL_BASED_OUTPATIENT_CLINIC_OR_DEPARTMENT_OTHER): Payer: Self-pay

## 2024-10-26 DIAGNOSIS — D219 Benign neoplasm of connective and other soft tissue, unspecified: Secondary | ICD-10-CM

## 2024-10-26 DIAGNOSIS — D7289 Other specified disorders of white blood cells: Secondary | ICD-10-CM | POA: Insufficient documentation

## 2024-10-26 DIAGNOSIS — E860 Dehydration: Secondary | ICD-10-CM | POA: Insufficient documentation

## 2024-10-26 DIAGNOSIS — R109 Unspecified abdominal pain: Secondary | ICD-10-CM

## 2024-10-26 DIAGNOSIS — D259 Leiomyoma of uterus, unspecified: Secondary | ICD-10-CM | POA: Insufficient documentation

## 2024-10-26 LAB — COMPREHENSIVE METABOLIC PANEL WITH GFR
ALT: 10 U/L (ref 0–44)
AST: 22 U/L (ref 15–41)
Albumin: 4.9 g/dL (ref 3.5–5.0)
Alkaline Phosphatase: 57 U/L (ref 38–126)
Anion gap: 18 — ABNORMAL HIGH (ref 5–15)
BUN: 10 mg/dL (ref 6–20)
CO2: 23 mmol/L (ref 22–32)
Calcium: 10.4 mg/dL — ABNORMAL HIGH (ref 8.9–10.3)
Chloride: 92 mmol/L — ABNORMAL LOW (ref 98–111)
Creatinine, Ser: 1.13 mg/dL — ABNORMAL HIGH (ref 0.44–1.00)
GFR, Estimated: >60 mL/min (ref >60)
Glucose, Bld: 123 mg/dL — ABNORMAL HIGH (ref 70–99)
Potassium: 3.6 mmol/L (ref 3.5–5.1)
Sodium: 133 mmol/L — ABNORMAL LOW (ref 135–145)
Total Bilirubin: 0.8 mg/dL (ref 0.0–1.2)
Total Protein: 8.3 g/dL — ABNORMAL HIGH (ref 6.5–8.1)

## 2024-10-26 LAB — URINALYSIS, ROUTINE W REFLEX MICROSCOPIC
Glucose, UA: NEGATIVE mg/dL
Ketones, ur: 15 mg/dL — AB
Leukocytes,Ua: NEGATIVE
Nitrite: NEGATIVE
Protein, ur: 30 mg/dL — AB
Specific Gravity, Urine: 1.025 (ref 1.005–1.030)
pH: 5 (ref 5.0–8.0)

## 2024-10-26 LAB — CBC
HCT: 48.2 % — ABNORMAL HIGH (ref 36.0–46.0)
Hemoglobin: 17.8 g/dL — ABNORMAL HIGH (ref 12.0–15.0)
MCH: 34.6 pg — ABNORMAL HIGH (ref 26.0–34.0)
MCHC: 36.9 g/dL — ABNORMAL HIGH (ref 30.0–36.0)
MCV: 93.6 fL (ref 80.0–100.0)
Platelets: 284 K/uL (ref 150–400)
RBC: 5.15 MIL/uL — ABNORMAL HIGH (ref 3.87–5.11)
RDW: 12.5 % (ref 11.5–15.5)
WBC: 15.1 K/uL — ABNORMAL HIGH (ref 4.0–10.5)
nRBC: 0 % (ref 0.0–0.2)

## 2024-10-26 LAB — URINALYSIS, MICROSCOPIC (REFLEX): WBC, UA: NONE SEEN WBC/hpf (ref 0–5)

## 2024-10-26 LAB — PREGNANCY, URINE: Preg Test, Ur: NEGATIVE

## 2024-10-26 LAB — LIPASE, BLOOD: Lipase: 21 U/L (ref 11–51)

## 2024-10-26 MED ORDER — IOHEXOL 300 MG/ML  SOLN
100.0000 mL | Freq: Once | INTRAMUSCULAR | Status: AC | PRN
Start: 2024-10-26 — End: 2024-10-26
  Administered 2024-10-26: 75 mL via INTRAVENOUS

## 2024-10-26 MED ORDER — MORPHINE SULFATE (PF) 4 MG/ML IV SOLN
4.0000 mg | Freq: Once | INTRAVENOUS | Status: AC
  Administered 2024-10-26: 4 mg via INTRAVENOUS
  Filled 2024-10-26: qty 1

## 2024-10-26 MED ORDER — HYDROMORPHONE HCL 1 MG/ML IJ SOLN
1.0000 mg | Freq: Once | INTRAMUSCULAR | Status: AC
  Administered 2024-10-26: 1 mg via INTRAVENOUS
  Filled 2024-10-26: qty 1

## 2024-10-26 MED ORDER — HYDROCODONE-ACETAMINOPHEN 5-325 MG PO TABS: 1.0000 | ORAL_TABLET | Freq: Four times a day (QID) | ORAL | 0 refills | Status: AC | PRN

## 2024-10-26 MED ORDER — ONDANSETRON HCL 4 MG PO TABS: 4.0000 mg | ORAL_TABLET | Freq: Four times a day (QID) | ORAL | 0 refills | Status: AC

## 2024-10-26 MED ORDER — FAMOTIDINE IN NACL 20-0.9 MG/50ML-% IV SOLN
20.0000 mg | Freq: Once | INTRAVENOUS | Status: AC
  Administered 2024-10-26: 20 mg via INTRAVENOUS
  Filled 2024-10-26: qty 50

## 2024-10-26 MED ORDER — ONDANSETRON HCL 4 MG/2ML IJ SOLN
4.0000 mg | Freq: Once | INTRAMUSCULAR | Status: AC
  Administered 2024-10-26: 4 mg via INTRAVENOUS
  Filled 2024-10-26: qty 2

## 2024-10-26 MED ORDER — SODIUM CHLORIDE 0.9 % IV BOLUS
1000.0000 mL | Freq: Once | INTRAVENOUS | Status: AC
  Administered 2024-10-26: 1000 mL via INTRAVENOUS

## 2024-10-26 NOTE — ED Notes (Signed)
 Hot packs /extra pillow for pain relief

## 2024-10-26 NOTE — ED Triage Notes (Signed)
 Hx of fibroids and gallstones. Upper abdominal pain radiating to right side and back. X 3 days. Vomiting bile. Difficult breathing due to pain Pain has been intermittent but worse this morning

## 2024-10-26 NOTE — Discharge Instructions (Signed)
 You have been seen and discharged from the emergency department.  You were found to have a very large fibroid, most likely the cause of your pain.  You were treated with IV pain medicine.  You have been prescribed oral pain medicine.  Take as directed.  Do not mix this medication with alcohol or other sedating medications. Do not drive or do heavy physical activity until you know how this medication affects you.  It may cause drowsiness.  Continue to follow-up with OB/GYN for options of possible surgical removal.  Follow-up with your primary provider for further evaluation and further care. Take home medications as prescribed. If you have any worsening symptoms or further concerns for your health please return to an emergency department for further evaluation.

## 2024-10-26 NOTE — ED Provider Notes (Signed)
 [facility] Provider Note   CSN: [id number] Arrival date & time: 10/26/24  1004     Patient presents with: Emesis and Abdominal Pain   Destiny Mercado is a 48 y.o. female.   HPI   48 year old female with past medical history of fibroid and gallstones presents emergency department with mid and upper abdominal pain, radiating to bilateral flanks.  This has been ongoing worsening for the past 3 days.  She also endorses nausea/vomiting and loose stools.  Nonbloody emesis/stools.  States that she has had pain like this before secondary to her fibroid.  Denies any acute fever.  Prior to Admission medications   Medication Sig Start Date End Date Taking? Authorizing Provider  ibuprofen  (ADVIL ) 600 MG tablet Take 1 tablet (600 mg total) by mouth every 6 (six) hours as needed. 04/18/22   Charlyn Sora, MD  ondansetron  (ZOFRAN -ODT) 8 MG disintegrating tablet Take 1 tablet (8 mg total) by mouth every 8 (eight) hours as needed for nausea. 04/18/22   Charlyn Sora, MD  oxyCODONE -acetaminophen  (PERCOCET/ROXICET) 5-325 MG tablet Take 1 tablet by mouth every 8 (eight) hours as needed for severe pain. 04/18/22   Charlyn Sora, MD  pantoprazole  (PROTONIX ) 40 MG tablet Take 1 tablet (40 mg total) by mouth daily. 04/18/22   Schuyler Charlie RAMAN, MD    Allergies: Patient has no known allergies.    Review of Systems  Constitutional:  Positive for fatigue. Negative for fever.  Respiratory:  Negative for shortness of breath.   Cardiovascular:  Negative for chest pain.  Gastrointestinal:  Positive for abdominal pain, nausea and vomiting. Negative for blood in stool and diarrhea.  Genitourinary:  Positive for flank pain.  Skin:  Negative for rash.  Neurological:  Negative for headaches.    Updated Vital Signs BP (!) 124/97   Pulse 92   Temp 97.8 F (36.6 C)   Resp 19   LMP  (LMP Unknown)   SpO2 100%   Physical Exam Vitals and nursing note reviewed.   Constitutional:      Appearance: Normal appearance.     Comments: Laying face down with heating pads on the stomach, clutching a pillow, moaning  HENT:     Head: Normocephalic.     Mouth/Throat:     Mouth: Mucous membranes are moist.  Cardiovascular:     Rate and Rhythm: Normal rate.  Pulmonary:     Effort: Pulmonary effort is normal. No respiratory distress.  Abdominal:     General: Bowel sounds are normal. There is no distension.     Palpations: Abdomen is soft.     Tenderness: There is generalized abdominal tenderness and tenderness in the periumbilical area. There is guarding. There is no rebound.  Skin:    General: Skin is warm.  Neurological:     Mental Status: She is alert and oriented to person, place, and time. Mental status is at baseline.  Psychiatric:        Mood and Affect: Mood normal.     (all labs ordered are listed, but only abnormal results are displayed) Labs Reviewed  COMPREHENSIVE METABOLIC PANEL WITH GFR - Abnormal; Notable for the following components:      Result Value   Sodium 133 (*)    Chloride 92 (*)    Glucose, Bld 123 (*)    Creatinine, Ser 1.13 (*)    Calcium 10.4 (*)    Total Protein 8.3 (*)    Anion gap 18 (*)  All other components within normal limits  CBC - Abnormal; Notable for the following components:   WBC 15.1 (*)    RBC 5.15 (*)    Hemoglobin 17.8 (*)    HCT 48.2 (*)    MCH 34.6 (*)    MCHC 36.9 (*)    All other components within normal limits  URINALYSIS, ROUTINE W REFLEX MICROSCOPIC - Abnormal; Notable for the following components:   Color, Urine AMBER (*)    APPearance CLOUDY (*)    Hgb urine dipstick TRACE (*)    Bilirubin Urine SMALL (*)    Ketones, ur 15 (*)    Protein, ur 30 (*)    All other components within normal limits  URINALYSIS, MICROSCOPIC (REFLEX) - Abnormal; Notable for the following components:   Bacteria, UA RARE (*)    All other components within normal limits  LIPASE, BLOOD  PREGNANCY, URINE     EKG: EKG Interpretation Date/Time:  Saturday October 26 2024 10:13:34 EST Ventricular Rate:  127 PR Interval:  137 QRS Duration:  81 QT Interval:  299 QTC Calculation: 435 R Axis:   81  Text Interpretation: Sinus tachycardia Left atrial enlargement Probable LVH with secondary repol abnrm ST depr, consider ischemia, inferior leads Baseline wander in lead(s) II aVR Confirmed by Bari Flank [phone]) on 10/26/2024 11:01:05 AM  Radiology: CT ABDOMEN PELVIS W CONTRAST Result Date: 10/26/2024 EXAM: CT ABDOMEN AND PELVIS WITH CONTRAST 10/26/2024 12:19:45 PM TECHNIQUE: CT of the abdomen and pelvis was performed with the administration of 75 mL of iohexol  (OMNIPAQUE ) 300 MG/ML solution. Multiplanar reformatted images are provided for review. Automated exposure control, iterative reconstruction, and/or weight-based adjustment of the mA/kV was utilized to reduce the radiation dose to as low as reasonably achievable. COMPARISON: CT abdomen and pelvis 04/18/2022. CLINICAL HISTORY: 48 year old female with acute, nonlocalized abdominal pain. FINDINGS: LOWER CHEST: No acute abnormality. LIVER: Liver is stable and within normal limits. A small but circumscribed low density area in the inferior right hepatic lobe on series 2 image 26 is stable and most likely a benign cyst or hemangioma (no follow up imaging recommended). GALLBLADDER AND BILE DUCTS: Chronically abnormal gallbladder with extensive stones in the gallbladder neck. Non dilated gallbladder. No pericholecystic inflammation identified. No biliary ductal dilatation. SPLEEN: No acute abnormality. PANCREAS: No acute abnormality. ADRENAL GLANDS: No acute abnormality. KIDNEYS, URETERS AND BLADDER: No stones in the kidneys or ureters. No hydronephrosis. No perinephric or periureteral stranding. Decompressed urinary bladder. GI AND BOWEL: Stomach demonstrates no acute abnormality. There is no bowel obstruction. No associated dilated bowel loops. Mildly gas  distended and redundant rectum in the deep pelvis. PERITONEUM AND RETROPERITONEUM: No ascites. No free air. No free fluid identified. VASCULATURE: Aorta is normal in caliber. Major arterial and portal venous structures appear patent and normal. LYMPH NODES: No lymphadenopathy. REPRODUCTIVE ORGANS: Massive chronic fibroid uterus. All told, the uterus encompasses 108 x 160 x 207 mm (AP by transverse by CC). Appears largely similar to the 2023 CT. However, a dorsal pedunculated uterine fibroid which projected into the right upper quadrant at that time now projects across midline to the left (series 2 image 34). But the enhancement pattern of all fibroids appears stable. Associated regional mass effect throughout the lower abdomen and the pelvis. Occasional pelvic phleboliths. BONES AND SOFT TISSUES: No acute osseous abnormality. No focal soft tissue abnormality. IMPRESSION: 1. Massively enlarged chronic fibroid uterus (estimated 1,885 mL) with chronic mass effect throughout the lower abdomen and pelvis. Compared to 2023 a  dorsal pedunculated fibroid has migrated across midline to the left, but the fibroid(s) enhancement pattern appears stable. 2. Chronic cholelithiasis with stones in the gallbladder neck, no CT evidence of acute cholecystitis. Electronically signed by: Helayne Hurst MD 10/26/2024 12:26 PM EST RP Workstation: [id number]     Procedures   Medications Ordered in the ED  ondansetron  (ZOFRAN ) injection 4 mg (4 mg Intravenous Given 10/26/24 1150)  sodium chloride  0.9 % bolus 1,000 mL (1,000 mLs Intravenous New Bag/Given 10/26/24 1155)  morphine  (PF) 4 MG/ML injection 4 mg (4 mg Intravenous Given 10/26/24 1152)  iohexol  (OMNIPAQUE ) 300 MG/ML solution 100 mL (75 mLs Intravenous Contrast Given 10/26/24 1209)                                    Medical Decision Making Amount and/or Complexity of Data Reviewed Labs: ordered. Radiology: ordered.  Risk Prescription drug  management.   48 year old female presents emergency department with abdominal pain.  History of large fibroid, she has had pain like this before secondary to the fibroid.  Denies any fever.  Abdomen is diffusely tender, slightly firm.  Blood work shows a mild leukocytosis of 15, mild dehydration, lipase is normal.  Urinalysis does not show UTI, pregnancy test is negative.  CT of the abdomen pelvis identifies ongoing massive fibroid in the abdomen, most likely the source of her discomfort.  She already has outpatient follow-up with OB/GYN and is exploring surgical options given her insurance status.  After IV pain medicine she feels improved.  We discussed oral pain medicine and symptomatic treatment and outpatient follow-up and she agrees.  Patient at this time appears safe and stable for discharge and close outpatient follow up. Discharge plan and strict return to ED precautions discussed, patient verbalizes understanding and agreement.     Final diagnoses:  None    ED Discharge Orders     None          Bari Roxie HERO, DO 10/26/24 1531
